# Patient Record
Sex: Female | Born: 1955 | Race: White | Hispanic: No | Marital: Married | Smoking: Former smoker
Health system: Southern US, Community
[De-identification: ages and names within clinical notes are randomized; demographics above are authoritative.]

## PROBLEM LIST (undated history)

## (undated) DIAGNOSIS — J449 Chronic obstructive pulmonary disease, unspecified: Secondary | ICD-10-CM

## (undated) DIAGNOSIS — M1990 Unspecified osteoarthritis, unspecified site: Secondary | ICD-10-CM

## (undated) DIAGNOSIS — I10 Essential (primary) hypertension: Secondary | ICD-10-CM

## (undated) DIAGNOSIS — K5730 Diverticulosis of large intestine without perforation or abscess without bleeding: Secondary | ICD-10-CM

## (undated) DIAGNOSIS — M5030 Other cervical disc degeneration, unspecified cervical region: Secondary | ICD-10-CM

## (undated) DIAGNOSIS — A0472 Enterocolitis due to Clostridium difficile, not specified as recurrent: Secondary | ICD-10-CM

## (undated) DIAGNOSIS — M797 Fibromyalgia: Secondary | ICD-10-CM

## (undated) HISTORY — PX: ABDOMINAL HYSTERECTOMY: SHX81

---

## 1993-04-19 HISTORY — PX: ABDOMINAL HYSTERECTOMY: SHX81

## 2009-01-17 DEATH — deceased

## 2010-04-25 ENCOUNTER — Ambulatory Visit
Admission: RE | Admit: 2010-04-25 | Discharge: 2010-04-25 | Payer: Self-pay | Source: Home / Self Care | Attending: Family Medicine | Admitting: Family Medicine

## 2010-04-25 ENCOUNTER — Encounter: Payer: Self-pay | Admitting: Family Medicine

## 2010-04-25 DIAGNOSIS — M1990 Unspecified osteoarthritis, unspecified site: Secondary | ICD-10-CM | POA: Insufficient documentation

## 2010-04-25 DIAGNOSIS — F329 Major depressive disorder, single episode, unspecified: Secondary | ICD-10-CM | POA: Insufficient documentation

## 2010-04-25 DIAGNOSIS — I10 Essential (primary) hypertension: Secondary | ICD-10-CM | POA: Insufficient documentation

## 2010-04-25 DIAGNOSIS — Z8719 Personal history of other diseases of the digestive system: Secondary | ICD-10-CM | POA: Insufficient documentation

## 2010-05-21 NOTE — Assessment & Plan Note (Signed)
Summary: REFILL PRESCRIPTION/EVM   Vital Signs:  Patient Profile:   55 Years Old Female CC:      rx refill Height:     60.5 inches Weight:      119 pounds BMI:     22.94 O2 Sat:      99 % O2 treatment:    Room Air Temp:     98.4 degrees F Pulse rate:   85 / minute BP sitting:   158 / 88  (left arm) Cuff size:   regular  Vitals Entered By: Haze Boyden, CMA (April 25, 2010 11:38 AM)                  Current Allergies (reviewed today): ! * PENICILLINHistory of Present Illness History from: patient Reason for visit: see chief complaint Chief Complaint: rx refill History of Present Illness: This patient is a 55 year old female who has relocated from [facility]. She reports that she grew up in [facility] and lives here now with her husband. She has not seen a physician in nearly 2 years. She reports that she stopped taking all her medications when she left [place]. She reports that she has osteoarthritis and depression and diverticulitis. She also has hypertension. She reports that after several months of not taking her medication she noticed an exacerbation of her arthritis. She reports that she started taking her Celebrex and Cymbalta and reports that there has been a significant improvement. She had used some of the old prescription bottles that she had left over at home. She is presenting today because she would like to have a prescription refill for her Cymbalta and also Celebrex. She reports that she was taking lisinopril until she stopped taking all of her medications many months back. She reports that she's having some fatigue but no chest pain or shortness of breath. She had a small bout of diverticulitis several weeks ago and was seen at the [facility]  urgent care. The patient reports that she had some blood work done at that time. She also says that she had a urinalysis done. She reports that she is trying to get an appointment with a  primary care physician at the [facility] clinic.  the patient does not have medical insurance at this time. She reports that she does have a new job and she has been working. The patient reports that when she started taking her Cymbalta and Celebrex again she felt much better. Her mood has improved considerably and she says that her joints feel much better. She is still having some joint pain but overall significant improvement. She had been seeing a rheumatologist in [facility] but reports that she would like to establish care with a rheumatologist locally.  REVIEW OF SYSTEMS Constitutional Symptoms       Complains of fatigue.     Denies fever, chills, night sweats, weight loss, and weight gain.  Eyes       Denies change in vision, eye pain, eye discharge, glasses, contact lenses, and eye surgery. Ear/Nose/Throat/Mouth       Complains of sinus problems.      Denies hearing loss/aids, change in hearing, ear pain, ear discharge, dizziness, frequent runny nose, frequent nose bleeds, sore throat, hoarseness, and tooth pain or bleeding.  Respiratory       Denies dry cough, productive cough, wheezing, shortness of breath, asthma, bronchitis, and emphysema/COPD.  Cardiovascular       Denies murmurs, chest pain, and tires easily  with exhertion.    Gastrointestinal       Denies stomach pain, nausea/vomiting, diarrhea, constipation, blood in bowel movements, and indigestion. Genitourniary       Denies painful urination, kidney stones, and loss of urinary control. Neurological       Denies paralysis, seizures, and fainting/blackouts. Musculoskeletal       Complains of muscle pain, joint pain, decreased range of motion, and muscle weakness.      Denies joint stiffness, redness, swelling, and gout.  Skin       Denies bruising, unusual mles/lumps or sores, and hair/skin or nail changes.  Psych       Complains of anxiety/stress and depression.      Denies mood changes, temper/anger issues,  speech problems, and sleep problems.  Past History:  Family History: Last updated: 04/28/2010 mother deceased father alive- heart disease sister alive healthy  Social History: Last updated: 04-28-10 Occupation: Designer, industrial/product work Married Current Smoker Alcohol use-yes Drug use-no Regular exercise-yes  Risk Factors: Exercise: yes (04-28-10)  Risk Factors: Smoking Status: current (04-28-10) Packs/Day: 1.0 (2010/04/28)  Past Medical History: Rheumatoid arthritis Diverticulitis, hx of Chronic Active Nicotine Dependence  Past Surgical History: Hysterectomy 16 years ago  Current Medications (verified): 1)  Cymbalta 60 Mg Cpep (Duloxetine Hcl) .... Take 1 Tabket By Mouth Once Daily 2)  Celebrex 200 Mg Caps (Celecoxib) .... Take 1 Tablet By Mouth Once Daily  Allergies (verified): 1)  ! * Penicillin   Family History: mother deceased father alive- heart disease sister alive healthy  Social History: Occupation: Designer, industrial/product work Married Current Smoker Alcohol use-yes Drug use-no Regular exercise-yes Smoking Status:  current Drug Use:  no Does Patient Exercise:  yes Packs/Day:  1.0 Physical Exam General appearance: well developed, well nourished, no acute distress Head: normocephalic, atraumatic Eyes: conjunctivae and lids normal Pupils: equal, round, reactive to light Ears: normal, no lesions or deformities Nasal: swollen red turbinates with congestion Oral/Pharynx: tongue normal, posterior pharynx without erythema or exudate Neck: neck supple,  trachea midline, no masses Chest/Lungs: hyperexpanded, no rales, wheezes, or rhonchi bilateral, breath sounds equal without effort Heart: regular rate and  rhythm, no murmur Abdomen: soft, non-tender without obvious organomegaly Extremities: arthritic changes seen in the hands and feet Neurological: grossly intact and non-focal Skin: no obvious rashes or lesions MSE: oriented to time, place, and  person Assessment Problems:   New Problems: UNSPECIFIED ESSENTIAL HYPERTENSION (ICD-401.9) DEPRESSION, MAJOR (ICD-296.20) DIVERTICULITIS, HX OF (ICD-V12.79) OSTEOARTHROS UNSPEC GEN/LOC OTH SPEC SITES (ZOX-096.04)   Patient Education: Patient and/or caregiver instructed in the following: rest, fluids. The risks, benefits and possible side effects were clearly explained and discussed with the patient.  The patient verbalized clear understanding.  The patient was given instructions to return if symptoms don't improve, worsen or new changes develop.  If it is not during clinic hours and the patient cannot get back to this clinic then the patient was told to seek medical care at an available urgent care or emergency department.  The patient verbalized understanding.   Demonstrates willingness to comply.  Plan New Medications/Changes: HYDROCHLOROTHIAZIDE 12.5 MG TABS (HYDROCHLOROTHIAZIDE) take 1 by mouth daily for blood pressure  #30 x 1, 2010-04-28, Asiah Befort MD CELEBREX 200 MG CAPS (CELECOXIB) take 1 by mouth daily as needed for arthritis pain  #30 x 0, 04/28/2010, Jakira Mcfadden MD CYMBALTA 60 MG CPEP (DULOXETINE HCL) take 1 by mouth daily  #30 x 2, 04/28/2010, Standley Dakins MD  Planning Comments:   the patient declined to have any  blood work done today. I recommended that she go ahead and have the blood work done but she declined. I also explained to the patient that because she's taking Celebrex we need to monitor her kidney function and blood pressure. She reports that she is scheduled to get a primary care doctor in the next 1-2 weeks and she will have a complete physical examination done at that time. In addition, I recommended that the patient resume taking blood pressure medications and having a blood pressure monitor closely. She stopped taking her blood pressure medication nearly a year ago and has not resumed taking it. I want to start her back on HCTZ 12.5 mg once daily and  have her to return to have her blood pressure checked in one week. She can either come back to this clinic or to have it done with her new primary care provider.  The Patient Verbalized Understanding  Follow Up: Follow up on an as needed basis, Follow up with Primary Physician Follow Up: one week with primary care provider  The patient and/or caregiver has been counseled thoroughly with regard to medications prescribed including dosage, schedule, interactions, rationale for use, and possible side effects and they verbalize understanding.  Diagnoses and expected course of recovery discussed and will return if not improved as expected or if the condition worsens. Patient and/or caregiver verbalized understanding.  Prescriptions: HYDROCHLOROTHIAZIDE 12.5 MG TABS (HYDROCHLOROTHIAZIDE) take 1 by mouth daily for blood pressure  #30 x 1   Entered and Authorized by:   Standley Dakins MD   Signed by:   Standley Dakins MD on 04/25/2010   Method used:   Electronically to        [facility] [phone]* (retail)       [address] [place], [place]  [address]       Ph: [phone] or [phone]       Fax: [phone]   RxID:   [id number] CELEBREX 200 MG CAPS (CELECOXIB) take 1 by mouth daily as needed for arthritis pain  #30 x 0   Entered and Authorized by:   Standley Dakins MD   Signed by:   Standley Dakins MD on 04/25/2010   Method used:   Electronically to        [facility] [phone]* (retail)       [address] [place], [place]  [address]       Ph: [phone] or [phone]       Fax: [phone]   RxID:   [id number] CYMBALTA 60 MG CPEP (DULOXETINE HCL) take 1 by mouth daily  #30 x 2   Entered and Authorized by:   Standley Dakins MD   Signed by:   Standley Dakins MD on 04/25/2010   Method used:   Electronically to        [facility] [phone]* (retail)       [address] [place], [place]   [address]       Ph: [phone] or [phone]       Fax: [phone]   RxID:   [id number]   Patient Instructions: 1)  Please have your blood pressure retested in one week. 2)  Please take your blood pressure medications. 3)  Go to the pharmacy and pick up your prescription (s).  It may take up to 30 mins for electronic prescriptions to be delivered to the pharmacy.  Please call if your pharmacy has not received your prescriptions after 30 minutes.   4)  Please get a primary care provider and start getting monitored and evaluated regularly for your medical conditions. 5)  Tobacco is very bad for your health and your loved ones! You Should stop smoking!. 6)  Stop Smoking Tips: Choose a Quit date. Cut down before the Quit date. decide what you will do as a substitute when you feel the urge to smoke(gum,toothpick,exercise). 7)  The patient was counseled and advised to stop using all tobacco products.  Medical assistance was offered and the patient was encouraged to call 1-800-QUIT-NOW to get a smoking cessation coach.    8)  The patient was informed that there is no on-call provider or services available at this clinic during off-hours (when the clinic is closed).  If the patient developed a problem or concern that required immediate attention, the patient was advised to go the the nearest available urgent care or emergency department for medical care.  The patient verbalized understanding.       Preventive Screening-Counseling & Management  Alcohol-Tobacco     Smoking Status: current     Smoking Cessation Counseling: yes     Smoke Cessation Stage: precontemplative     Packs/Day: 1.0     Year Started: 1990     Tobacco Counseling: to quit use of tobacco products  Caffeine-Diet-Exercise     Does Patient Exercise: yes      Drug Use:  no.

## 2010-05-21 NOTE — Progress Notes (Signed)
Summary: Medical records  Medical records   Imported By: Dorna Leitz 04/27/2010 15:50:12  _____________________________________________________________________  External Attachment:    Type:   Image     Comment:   External Document

## 2010-08-27 ENCOUNTER — Ambulatory Visit: Payer: Self-pay | Admitting: Internal Medicine

## 2010-08-28 ENCOUNTER — Ambulatory Visit: Payer: Self-pay | Admitting: Internal Medicine

## 2010-10-12 ENCOUNTER — Ambulatory Visit: Payer: Self-pay | Admitting: Gastroenterology

## 2011-09-30 ENCOUNTER — Ambulatory Visit: Payer: Self-pay | Admitting: Internal Medicine

## 2012-10-02 ENCOUNTER — Ambulatory Visit: Payer: Self-pay | Admitting: Internal Medicine

## 2013-10-05 ENCOUNTER — Ambulatory Visit: Payer: Self-pay | Admitting: Internal Medicine

## 2013-12-03 ENCOUNTER — Ambulatory Visit: Payer: Self-pay | Admitting: Gastroenterology

## 2013-12-04 LAB — PATHOLOGY REPORT

## 2014-07-01 ENCOUNTER — Emergency Department: Payer: Self-pay | Admitting: Emergency Medicine

## 2014-07-24 ENCOUNTER — Other Ambulatory Visit
Admit: 2014-07-24 | Disposition: A | Discharge status: Home / Self Care | Payer: Self-pay | Attending: Gastroenterology | Admitting: Gastroenterology

## 2014-07-24 LAB — CLOSTRIDIUM DIFFICILE(ARMC)

## 2014-08-16 ENCOUNTER — Other Ambulatory Visit
Admit: 2014-08-16 | Disposition: A | Discharge status: Home / Self Care | Payer: Self-pay | Attending: Gastroenterology | Admitting: Gastroenterology

## 2014-08-16 LAB — CLOSTRIDIUM DIFFICILE(ARMC)

## 2014-08-18 LAB — STOOL CULTURE

## 2014-09-02 ENCOUNTER — Encounter: Payer: Self-pay | Admitting: *Deleted

## 2014-09-02 ENCOUNTER — Ambulatory Visit: Payer: PRIVATE HEALTH INSURANCE | Admitting: Anesthesiology

## 2014-09-02 ENCOUNTER — Ambulatory Visit
Admission: RE | Admit: 2014-09-02 | Discharge: 2014-09-02 | Disposition: A | Discharge status: Home / Self Care | Payer: PRIVATE HEALTH INSURANCE | Source: Ambulatory Visit | Attending: Unknown Physician Specialty | Admitting: Unknown Physician Specialty

## 2014-09-02 ENCOUNTER — Encounter
Admission: RE | Disposition: A | Discharge status: Home / Self Care | Payer: Self-pay | Source: Ambulatory Visit | Attending: Unknown Physician Specialty

## 2014-09-02 DIAGNOSIS — F17210 Nicotine dependence, cigarettes, uncomplicated: Secondary | ICD-10-CM | POA: Insufficient documentation

## 2014-09-02 DIAGNOSIS — M5030 Other cervical disc degeneration, unspecified cervical region: Secondary | ICD-10-CM | POA: Diagnosis not present

## 2014-09-02 DIAGNOSIS — M1990 Unspecified osteoarthritis, unspecified site: Secondary | ICD-10-CM | POA: Diagnosis not present

## 2014-09-02 DIAGNOSIS — I10 Essential (primary) hypertension: Secondary | ICD-10-CM | POA: Insufficient documentation

## 2014-09-02 DIAGNOSIS — J449 Chronic obstructive pulmonary disease, unspecified: Secondary | ICD-10-CM | POA: Insufficient documentation

## 2014-09-02 DIAGNOSIS — Z880 Allergy status to penicillin: Secondary | ICD-10-CM | POA: Insufficient documentation

## 2014-09-02 DIAGNOSIS — K5730 Diverticulosis of large intestine without perforation or abscess without bleeding: Secondary | ICD-10-CM | POA: Insufficient documentation

## 2014-09-02 DIAGNOSIS — K640 First degree hemorrhoids: Secondary | ICD-10-CM | POA: Insufficient documentation

## 2014-09-02 DIAGNOSIS — Z885 Allergy status to narcotic agent status: Secondary | ICD-10-CM | POA: Insufficient documentation

## 2014-09-02 DIAGNOSIS — Z90710 Acquired absence of both cervix and uterus: Secondary | ICD-10-CM | POA: Insufficient documentation

## 2014-09-02 DIAGNOSIS — Z79899 Other long term (current) drug therapy: Secondary | ICD-10-CM | POA: Insufficient documentation

## 2014-09-02 DIAGNOSIS — K648 Other hemorrhoids: Secondary | ICD-10-CM | POA: Diagnosis not present

## 2014-09-02 DIAGNOSIS — A047 Enterocolitis due to Clostridium difficile: Secondary | ICD-10-CM | POA: Diagnosis not present

## 2014-09-02 HISTORY — DX: Unspecified osteoarthritis, unspecified site: M19.90

## 2014-09-02 HISTORY — DX: Essential (primary) hypertension: I10

## 2014-09-02 HISTORY — PX: FECAL TRANSPLANT: SHX6383

## 2014-09-02 HISTORY — DX: Chronic obstructive pulmonary disease, unspecified: J44.9

## 2014-09-02 HISTORY — DX: Other cervical disc degeneration, unspecified cervical region: M50.30

## 2014-09-02 HISTORY — DX: Fibromyalgia: M79.7

## 2014-09-02 HISTORY — DX: Diverticulosis of large intestine without perforation or abscess without bleeding: K57.30

## 2014-09-02 HISTORY — PX: COLONOSCOPY: SHX5424

## 2014-09-02 SURGERY — COLONOSCOPY
Anesthesia: General

## 2014-09-02 MED ORDER — SODIUM CHLORIDE 0.9 % IV SOLN: INTRAVENOUS | Status: DC

## 2014-09-02 MED ORDER — PROPOFOL 10 MG/ML IV BOLUS
INTRAVENOUS | Status: DC | PRN
  Administered 2014-09-02: 80 mg via INTRAVENOUS

## 2014-09-02 MED ORDER — PROPOFOL INFUSION 10 MG/ML OPTIME
INTRAVENOUS | Status: DC | PRN
  Administered 2014-09-02: 120 ug/kg/min via INTRAVENOUS

## 2014-09-02 MED ORDER — LACTATED RINGERS IV SOLN
INTRAVENOUS | Status: DC
  Administered 2014-09-02: 11:00:00 via INTRAVENOUS

## 2014-09-02 NOTE — Anesthesia Postprocedure Evaluation (Signed)
  Anesthesia Post-op Note  Patient: Rose Lloyd  Procedure(s) Performed: Procedure(s): COLONOSCOPY (N/A) FECAL TRANSPLANT (N/A)  Anesthesia type:General  Patient location: PACU  Post pain: Pain level controlled  Post assessment: Post-op Vital signs reviewed, Patient's Cardiovascular Status Stable, Respiratory Function Stable, Patent Airway and No signs of Nausea or vomiting  Post vital signs: Reviewed and stable  Last Vitals:  Filed Vitals:   09/02/14 1035  BP: 138/79  Pulse: 75  Temp: 36.7 C  Resp: 18    Level of consciousness: awake, alert  and patient cooperative  Complications: No apparent anesthesia complications

## 2014-09-02 NOTE — H&P (Signed)
   Primary Care Physician:  Idelle Crouch, MD Primary Gastroenterologist:  Dr. Vira Agar  Pre-Procedure History & Physical: HPI:  Rose Lloyd is a 59 y.o. female is here for an colonoscopy.   Past Medical History  Diagnosis Date  . COPD (chronic obstructive pulmonary disease)   . Hypertension   . Osteoarthritis   . Degenerative disc disease, cervical   . Diverticula of colon     Past Surgical History  Procedure Laterality Date  . Abdominal hysterectomy      Prior to Admission medications   Medication Sig Start Date End Date Taking? Authorizing Provider  acetaminophen (TYLENOL) 500 MG tablet Take 500 mg by mouth every 6 (six) hours as needed for moderate pain.   Yes Historical Provider, MD  diclofenac (VOLTAREN) 75 MG EC tablet Take 75 mg by mouth once.   Yes Historical Provider, MD  DULoxetine (CYMBALTA) 60 MG capsule Take 60 mg by mouth daily.   Yes Historical Provider, MD  lisinopril (PRINIVIL,ZESTRIL) 10 MG tablet Take 10 mg by mouth daily.   Yes Historical Provider, MD  Probiotic Product (PROBIOTIC DAILY PO) Take 1 capsule by mouth daily.   Yes Historical Provider, MD  vancomycin (VANCOCIN) 250 MG capsule Take 250 mg by mouth 4 (four) times daily.    Historical Provider, MD    Allergies as of 08/26/2014 - Review Complete 08/26/2014  Allergen Reaction Noted  . Codeine Nausea And Vomiting 08/26/2014  . Penicillins  04/25/2010    History reviewed. No pertinent family history.  History   Social History  . Marital Status: Married    Spouse Name: N/A  . Number of Children: N/A  . Years of Education: N/A   Occupational History  . Not on file.   Social History Main Topics  . Smoking status: Current Every Day Smoker -- 1.00 packs/day for 40 years    Types: Cigarettes  . Smokeless tobacco: Not on file  . Alcohol Use: No  . Drug Use: No  . Sexual Activity: Not on file   Other Topics Concern  . Not on file   Social History Narrative    Review of  Systems: See HPI, otherwise negative ROS  Physical Exam: BP 138/79 mmHg  Pulse 75  Temp(Src) 98.1 F (36.7 C) (Tympanic)  Resp 18  Ht 4\' 11"  (1.499 m)  Wt 50.803 kg (112 lb)  BMI 22.61 kg/m2 General:   Alert,  pleasant and cooperative in NAD Head:  Normocephalic and atraumatic. Neck:  Supple; no masses or thyromegaly. Lungs:  Clear throughout to auscultation.    Heart:  Regular rate and rhythm. Abdomen:  Soft, nontender and nondistended. Normal bowel sounds, without guarding, and without rebound.   Neurologic:  Alert and  oriented x4;  grossly normal neurologically.  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  Questions have been answered.  All parties agreeable.   Gaylyn Cheers, MD  09/02/2014, 11:57 AM

## 2014-09-02 NOTE — Op Note (Signed)
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 Doy Hutching, MD (Referring MD) Medicines:         Propofol per Anesthesia Complications:     No immediate complications. Procedure:         Pre-Anesthesia Assessment:                    - After reviewing the risks and benefits, the patient was                     deemed in satisfactory condition to undergo the procedure.                    After obtaining informed consent, the colonoscope was                     passed under direct vision. Throughout the procedure, the                     patient's blood pressure, pulse, and oxygen saturations                     were monitored continuously. The Colonoscope was                     introduced through the anus and advanced to the the cecum,                     identified by appendiceal orifice and ileocecal valve. The                     colonoscopy was performed without difficulty. The patient                     tolerated the procedure well. The quality of the bowel                     preparation was excellent. Findings:      The colon (entire examined portion) appeared normal. The scope was       passed to the cecum and then Fecal Microbiota Transplant       (Bacteriotherapy): Donor stool was prepared by me using milk and tap       water as per protocol. Approximately 350 mL of the emulsified donor       stool was instilled at the hepatic flexure, in the ascending colon and       in the cecum. A detailed colonoscopic exam could not be performed upon   scope withdrawal secondary to limited visibility from the instilled       stool.      Internal hemorrhoids were found during endoscopy. The hemorrhoids were       small and Grade I (internal hemorrhoids that do not prolapse). Impression:        -  The entire examined colon is normal.                    - Internal hemorrhoids.                    - Fecal Microbiota Transplant (Bacteriotherapy) performed                     in the hepatic flexure, in the ascending colon and in the                     cecum.                    - No specimens collected. Recommendation:    - The findings and recommendations were discussed with the                     patient's family. Remain in post op for 4 hours with                     turning every 30 minutes. Manya Silvas, MD 09/02/2014 12:22:55 PM This report has been signed electronically. Number of Addenda: 0 Note Initiated On: 09/02/2014 10:59 AM Scope Withdrawal Time: 0 hours 6 minutes 28 seconds  Total Procedure Duration: 0 hours 15 minutes 42 seconds       [facility]

## 2014-09-02 NOTE — Anesthesia Preprocedure Evaluation (Addendum)
Anesthesia Evaluation    Airway Mallampati: I       Dental  (+) Teeth Intact   Pulmonary COPDCurrent Smoker (1 ppd),          Cardiovascular hypertension, Pt. on medications     Neuro/Psych Depression    GI/Hepatic   Endo/Other    Renal/GU      Musculoskeletal  (+) Arthritis -, Osteoarthritis,    Abdominal   Peds  Hematology   Anesthesia Other Findings   Reproductive/Obstetrics                            Anesthesia Physical Anesthesia Plan  ASA: II  Anesthesia Plan: General   Post-op Pain Management:    Induction: Intravenous  Airway Management Planned: Nasal Cannula  Additional Equipment:   Intra-op Plan:   Post-operative Plan:   Informed Consent: I have reviewed the patients History and Physical, chart, labs and discussed the procedure including the risks, benefits and alternatives for the proposed anesthesia with the patient or authorized representative who has indicated his/her understanding and acceptance.     Plan Discussed with:   Anesthesia Plan Comments:         Anesthesia Quick Evaluation

## 2014-09-02 NOTE — Transfer of Care (Signed)
Immediate Anesthesia Transfer of Care Note  Patient: Rose Lloyd  Procedure(s) Performed: Procedure(s): COLONOSCOPY (N/A) FECAL TRANSPLANT (N/A)  Patient Location: PACU  Anesthesia Type:General  Level of Consciousness: awake, alert  and oriented  Airway & Oxygen Therapy: Patient Spontanous Breathing and Patient connected to nasal cannula oxygen  Post-op Assessment: Report given to RN  Post vital signs: Reviewed and stable  Last Vitals:  Filed Vitals:   09/02/14 1226  BP:   Pulse:   Temp: 36.3 C  Resp:     Complications: No apparent anesthesia complications

## 2014-09-03 ENCOUNTER — Encounter: Payer: Self-pay | Admitting: Unknown Physician Specialty

## 2014-10-23 ENCOUNTER — Other Ambulatory Visit: Payer: Self-pay | Admitting: Internal Medicine

## 2014-10-23 DIAGNOSIS — J431 Panlobular emphysema: Secondary | ICD-10-CM

## 2014-10-23 DIAGNOSIS — R911 Solitary pulmonary nodule: Secondary | ICD-10-CM

## 2014-10-29 ENCOUNTER — Ambulatory Visit
Admission: RE | Admit: 2014-10-29 | Discharge: 2014-10-29 | Disposition: A | Discharge status: Home / Self Care | Payer: PRIVATE HEALTH INSURANCE | Source: Ambulatory Visit | Attending: Internal Medicine | Admitting: Internal Medicine

## 2014-10-29 DIAGNOSIS — R05 Cough: Secondary | ICD-10-CM | POA: Diagnosis not present

## 2014-10-29 DIAGNOSIS — J431 Panlobular emphysema: Secondary | ICD-10-CM

## 2014-10-29 DIAGNOSIS — R911 Solitary pulmonary nodule: Secondary | ICD-10-CM | POA: Diagnosis not present

## 2014-10-29 DIAGNOSIS — R0602 Shortness of breath: Secondary | ICD-10-CM | POA: Insufficient documentation

## 2014-10-29 MED ORDER — IOHEXOL 300 MG/ML  SOLN
75.0000 mL | Freq: Once | INTRAMUSCULAR | Status: AC | PRN
Start: 2014-10-29 — End: 2014-10-29
  Administered 2014-10-29: 75 mL via INTRAVENOUS

## 2014-10-30 ENCOUNTER — Other Ambulatory Visit: Payer: Self-pay | Admitting: Internal Medicine

## 2014-10-30 DIAGNOSIS — R911 Solitary pulmonary nodule: Secondary | ICD-10-CM

## 2015-05-01 ENCOUNTER — Ambulatory Visit: Payer: PRIVATE HEALTH INSURANCE

## 2015-05-05 ENCOUNTER — Ambulatory Visit
Admission: RE | Admit: 2015-05-05 | Discharge: 2015-05-05 | Disposition: A | Discharge status: Home / Self Care | Payer: PRIVATE HEALTH INSURANCE | Source: Ambulatory Visit | Attending: Internal Medicine | Admitting: Internal Medicine

## 2015-05-05 DIAGNOSIS — R918 Other nonspecific abnormal finding of lung field: Secondary | ICD-10-CM | POA: Insufficient documentation

## 2015-05-05 DIAGNOSIS — R911 Solitary pulmonary nodule: Secondary | ICD-10-CM

## 2015-05-05 MED ORDER — IOHEXOL 300 MG/ML  SOLN
75.0000 mL | Freq: Once | INTRAMUSCULAR | Status: AC | PRN
  Administered 2015-05-05: 75 mL via INTRAVENOUS

## 2015-08-05 ENCOUNTER — Other Ambulatory Visit: Payer: Self-pay | Admitting: Internal Medicine

## 2015-08-05 DIAGNOSIS — R109 Unspecified abdominal pain: Secondary | ICD-10-CM

## 2015-08-27 ENCOUNTER — Ambulatory Visit
Admission: RE | Admit: 2015-08-27 | Discharge: 2015-08-27 | Disposition: A | Discharge status: Home / Self Care | Payer: PRIVATE HEALTH INSURANCE | Source: Ambulatory Visit | Attending: Internal Medicine | Admitting: Internal Medicine

## 2015-08-27 DIAGNOSIS — R109 Unspecified abdominal pain: Secondary | ICD-10-CM

## 2015-08-27 DIAGNOSIS — D1803 Hemangioma of intra-abdominal structures: Secondary | ICD-10-CM | POA: Diagnosis not present

## 2015-08-27 MED ORDER — GADOBENATE DIMEGLUMINE 529 MG/ML IV SOLN
15.0000 mL | Freq: Once | INTRAVENOUS | Status: AC | PRN
  Administered 2015-08-27: 12 mL via INTRAVENOUS

## 2015-09-30 ENCOUNTER — Other Ambulatory Visit: Payer: Self-pay | Admitting: Internal Medicine

## 2015-09-30 DIAGNOSIS — Z1231 Encounter for screening mammogram for malignant neoplasm of breast: Secondary | ICD-10-CM

## 2015-10-17 ENCOUNTER — Ambulatory Visit: Payer: PRIVATE HEALTH INSURANCE

## 2015-11-03 ENCOUNTER — Ambulatory Visit
Admission: RE | Admit: 2015-11-03 | Discharge: 2015-11-03 | Disposition: A | Discharge status: Home / Self Care | Payer: PRIVATE HEALTH INSURANCE | Source: Ambulatory Visit | Attending: Internal Medicine | Admitting: Internal Medicine

## 2015-11-03 DIAGNOSIS — Z1231 Encounter for screening mammogram for malignant neoplasm of breast: Secondary | ICD-10-CM | POA: Insufficient documentation

## 2016-09-09 ENCOUNTER — Other Ambulatory Visit: Payer: Self-pay | Admitting: Internal Medicine

## 2016-09-09 DIAGNOSIS — R911 Solitary pulmonary nodule: Secondary | ICD-10-CM

## 2016-10-01 ENCOUNTER — Ambulatory Visit: Payer: PRIVATE HEALTH INSURANCE

## 2016-10-08 ENCOUNTER — Ambulatory Visit
Admission: RE | Admit: 2016-10-08 | Discharge: 2016-10-08 | Disposition: A | Discharge status: Home / Self Care | Payer: PRIVATE HEALTH INSURANCE | Source: Ambulatory Visit | Attending: Internal Medicine | Admitting: Internal Medicine

## 2016-10-08 DIAGNOSIS — R918 Other nonspecific abnormal finding of lung field: Secondary | ICD-10-CM | POA: Diagnosis not present

## 2016-10-08 DIAGNOSIS — I700 Atherosclerosis of aorta: Secondary | ICD-10-CM | POA: Diagnosis not present

## 2016-10-08 DIAGNOSIS — R911 Solitary pulmonary nodule: Secondary | ICD-10-CM

## 2016-10-16 ENCOUNTER — Encounter: Payer: Self-pay | Admitting: Emergency Medicine

## 2016-10-16 ENCOUNTER — Emergency Department
Admission: EM | Admit: 2016-10-16 | Discharge: 2016-10-16 | Disposition: A | Discharge status: Home / Self Care | Payer: PRIVATE HEALTH INSURANCE | Attending: Emergency Medicine | Admitting: Emergency Medicine

## 2016-10-16 DIAGNOSIS — A09 Infectious gastroenteritis and colitis, unspecified: Secondary | ICD-10-CM | POA: Insufficient documentation

## 2016-10-16 DIAGNOSIS — A0472 Enterocolitis due to Clostridium difficile, not specified as recurrent: Secondary | ICD-10-CM | POA: Diagnosis not present

## 2016-10-16 DIAGNOSIS — F17228 Nicotine dependence, chewing tobacco, with other nicotine-induced disorders: Secondary | ICD-10-CM | POA: Diagnosis not present

## 2016-10-16 DIAGNOSIS — J449 Chronic obstructive pulmonary disease, unspecified: Secondary | ICD-10-CM | POA: Diagnosis not present

## 2016-10-16 DIAGNOSIS — I10 Essential (primary) hypertension: Secondary | ICD-10-CM | POA: Insufficient documentation

## 2016-10-16 DIAGNOSIS — R197 Diarrhea, unspecified: Secondary | ICD-10-CM | POA: Diagnosis present

## 2016-10-16 HISTORY — DX: Enterocolitis due to Clostridium difficile, not specified as recurrent: A04.72

## 2016-10-16 LAB — COMPREHENSIVE METABOLIC PANEL
ALBUMIN: 4 g/dL (ref 3.5–5.0)
ALT: 18 U/L (ref 14–54)
AST: 20 U/L (ref 15–41)
Alkaline Phosphatase: 42 U/L (ref 38–126)
Anion gap: 6 (ref 5–15)
BUN: 8 mg/dL (ref 6–20)
CHLORIDE: 104 mmol/L (ref 101–111)
CO2: 27 mmol/L (ref 22–32)
CREATININE: 0.63 mg/dL (ref 0.44–1.00)
Calcium: 9.4 mg/dL (ref 8.9–10.3)
GFR calc non Af Amer: >60 mL/min (ref >60)
GLUCOSE: 110 mg/dL — AB (ref 65–99)
Potassium: 3.7 mmol/L (ref 3.5–5.1)
SODIUM: 137 mmol/L (ref 135–145)
Total Bilirubin: 0.7 mg/dL (ref 0.3–1.2)
Total Protein: 7.2 g/dL (ref 6.5–8.1)

## 2016-10-16 LAB — URINALYSIS, COMPLETE (UACMP) WITH MICROSCOPIC
Bacteria, UA: NONE SEEN
Bilirubin Urine: NEGATIVE
Glucose, UA: NEGATIVE mg/dL
KETONES UR: NEGATIVE mg/dL
Leukocytes, UA: NEGATIVE
Nitrite: NEGATIVE
PH: 7 (ref 5.0–8.0)
PROTEIN: NEGATIVE mg/dL
Specific Gravity, Urine: 1.015 (ref 1.005–1.030)

## 2016-10-16 LAB — CBC
HCT: 41.1 % (ref 35.0–47.0)
Hemoglobin: 14.3 g/dL (ref 12.0–16.0)
MCH: 33.3 pg (ref 26.0–34.0)
MCHC: 34.8 g/dL (ref 32.0–36.0)
MCV: 95.8 fL (ref 80.0–100.0)
PLATELETS: 285 10*3/uL (ref 150–440)
RBC: 4.29 MIL/uL (ref 3.80–5.20)
RDW: 13.3 % (ref 11.5–14.5)
WBC: 4.9 10*3/uL (ref 3.6–11.0)

## 2016-10-16 LAB — LIPASE, BLOOD: Lipase: 37 U/L (ref 11–51)

## 2016-10-16 LAB — CLOSTRIDIUM DIFFICILE BY PCR: Toxigenic C. Difficile by PCR: POSITIVE — AB

## 2016-10-16 LAB — C DIFFICILE QUICK SCREEN W PCR REFLEX
C DIFFICILE (CDIFF) TOXIN: NEGATIVE
C Diff antigen: POSITIVE — AB

## 2016-10-16 MED ORDER — DICYCLOMINE HCL 20 MG PO TABS: 20.0000 mg | ORAL_TABLET | Freq: Three times a day (TID) | ORAL | 0 refills | Status: DC | PRN

## 2016-10-16 MED ORDER — VANCOMYCIN HCL 250 MG PO CAPS
250.0000 mg | ORAL_CAPSULE | Freq: Four times a day (QID) | ORAL | 0 refills | Status: AC
Start: 2016-10-16 — End: 2016-10-26

## 2016-10-16 NOTE — Discharge Instructions (Signed)
Please seek medical attention for any high fevers, chest pain, shortness of breath, change in behavior, persistent vomiting, bloody stool or any other new or concerning symptoms.  

## 2016-10-16 NOTE — ED Provider Notes (Signed)
[facility] Emergency Department Provider Note    ____________________________________________   I have reviewed the triage vital signs and the nursing notes.   HISTORY  Chief Complaint Diarrhea   History limited by: Not Limited   HPI Rose Lloyd is a 61 y.o. female who presents to the emergency department today because of concerns for diarrhea abdominal cramping and potential C. difficile. The patient states that the diarrhea started yesterday. She had over 20 episodes. Small amount of mucus and blood was noted however profound bleeding. This was coming by abdominal cramping primarily lower abdomen. She states the pain is severe when it comes on. 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 She did just come off a course of antibiotics for dental infection.   Past Medical History:  Diagnosis Date  . Clostridium difficile diarrhea   . COPD (chronic obstructive pulmonary disease) ([place])   . Degenerative disc disease, cervical   . Diverticula of colon   . Hypertension   . Osteoarthritis     Patient Active Problem List   Diagnosis Date Noted  . DEPRESSION, MAJOR 04/25/2010  . UNSPECIFIED ESSENTIAL HYPERTENSION 04/25/2010  . OSTEOARTHROS UNSPEC GEN/LOC OTH SPEC SITES 04/25/2010  . DIVERTICULITIS, HX OF 04/25/2010    Past Surgical History:  Procedure Laterality Date  . ABDOMINAL HYSTERECTOMY    . COLONOSCOPY N/A 09/02/2014   Procedure: COLONOSCOPY;  Surgeon: Manya Silvas, MD;  Location: [facility] ENDOSCOPY;  Service: Endoscopy;  Laterality: N/A;  . FECAL TRANSPLANT N/A 09/02/2014   Procedure: FECAL TRANSPLANT;  Surgeon: Manya Silvas, MD;  Location: [facility] ENDOSCOPY;  Service: Endoscopy;  Laterality: N/A;    Prior to Admission medications   Medication Sig Start Date End Date Taking? Authorizing Provider  acetaminophen (TYLENOL) 500 MG tablet Take 500 mg by mouth every 6 (six) hours as needed for moderate pain.     [provider]  diclofenac (VOLTAREN) 75 MG EC tablet Take 75 mg by mouth once.    [provider]  DULoxetine (CYMBALTA) 60 MG capsule Take 60 mg by mouth daily.    [provider]  lisinopril (PRINIVIL,ZESTRIL) 10 MG tablet Take 10 mg by mouth daily.    [provider]  Probiotic Product (PROBIOTIC DAILY PO) Take 1 capsule by mouth daily.    [provider]  vancomycin (VANCOCIN) 250 MG capsule Take 250 mg by mouth 4 (four) times daily.    [provider]    Allergies Codeine and Penicillins  Family History  Problem Relation Age of Onset  . Breast cancer Maternal Grandmother        65's    Social History Social History  Substance Use Topics  . Smoking status: Former Smoker    Packs/day: 1.00    Years: 40.00    Types: E-cigarettes    Quit date: 2016  . Smokeless tobacco: Current User  . Alcohol use No    Review of Systems Constitutional: Positive for fever. Eyes: No visual changes. ENT: Positive for right sided mouth pain that has resolved. Cardiovascular: Denies chest pain. Respiratory: Denies shortness of breath. Gastrointestinal: Positive for abdominal cramping and diarrhea. Genitourinary: Negative for dysuria. Musculoskeletal: Positive for low back soreness. Skin: Negative for rash. Neurological: Negative for headaches, focal weakness or numbness.  ____________________________________________   PHYSICAL EXAM:  VITAL SIGNS: ED Triage Vitals  Enc Vitals Group     BP 10/16/16 1400 (!) 150/90     Pulse Rate 10/16/16 1400 80  Resp 10/16/16 1400 20     Temp 10/16/16 1400 98.3 F (36.8 C)     Temp Source 10/16/16 1400 Oral     SpO2 10/16/16 1400 100 %     Weight 10/16/16 1401 128 lb (58.1 kg)     Height 10/16/16 1401 4\' 10"  (1.473 m)     Head Circumference --      Peak Flow --      Pain Score 10/16/16 1502 10   Constitutional: Alert and oriented. Well appearing and in no distress. Eyes: Conjunctivae  are normal.  ENT   Head: Normocephalic and atraumatic.   Nose: No congestion/rhinnorhea.   Mouth/Throat: Mucous membranes are moist.   Neck: No stridor. Hematological/Lymphatic/Immunilogical: No cervical lymphadenopathy. Cardiovascular: Normal rate, regular rhythm.  No murmurs, rubs, or gallops.  Respiratory: Normal respiratory effort without tachypnea nor retractions. Breath sounds are clear and equal bilaterally. No wheezes/rales/rhonchi. Gastrointestinal: Soft and mild tenderness to palpation in the lower abdomen. No rebound. No guarding.  Genitourinary: Deferred Musculoskeletal: Normal range of motion in all extremities. No lower extremity edema. Neurologic:  Normal speech and language. No gross focal neurologic deficits are appreciated.  Skin:  Skin is warm, dry and intact. No rash noted. Psychiatric: Mood and affect are normal. Speech and behavior are normal. Patient exhibits appropriate insight and judgment.  ____________________________________________    LABS (pertinent positives/negatives)  Labs Reviewed  C DIFFICILE QUICK SCREEN W PCR REFLEX - Abnormal; Notable for the following:       Result Value   C Diff antigen POSITIVE (*)    All other components within normal limits  CLOSTRIDIUM DIFFICILE BY PCR - Abnormal; Notable for the following:    Toxigenic C Difficile by pcr POSITIVE (*)    All other components within normal limits  COMPREHENSIVE METABOLIC PANEL - Abnormal; Notable for the following:    Glucose, Bld 110 (*)    All other components within normal limits  URINALYSIS, COMPLETE (UACMP) WITH MICROSCOPIC - Abnormal; Notable for the following:    Color, Urine YELLOW (*)    APPearance CLEAR (*)    Hgb urine dipstick SMALL (*)    Squamous Epithelial / LPF 0-5 (*)    All other components within normal limits  GASTROINTESTINAL PANEL BY PCR, STOOL (REPLACES STOOL CULTURE)  LIPASE, BLOOD  CBC      ____________________________________________   EKG  None  ____________________________________________    RADIOLOGY  None  ____________________________________________   PROCEDURES  Procedures  ____________________________________________   INITIAL IMPRESSION / ASSESSMENT AND PLAN / ED COURSE  Pertinent labs & imaging results that were available during my care of the patient were reviewed by me and considered in my medical decision making (see chart for details).  She presented to the emergency department today because of concerns for diarrhea and recurrence of Clostridium difficile. Unfortunately labwork today's consistent with C. difficile. Will start patient on vancomycin. Did discuss with patient importance of GI follow-up.  ____________________________________________   FINAL CLINICAL IMPRESSION(S) / ED DIAGNOSES  Final diagnoses:  Diarrhea of infectious origin  C. difficile diarrhea     Note: This dictation was prepared with Dragon dictation. Any transcriptional errors that result from this process are unintentional     Nance Pear, MD 10/16/16 [phone]

## 2016-10-16 NOTE — ED Triage Notes (Signed)
Pt presents to ED c/o diarrhea. Pt states she had 22 episodes yesterday. Has had c diff 3 times and needed fecal transplant. Has been on erythromycin for dental infection recently.

## 2016-12-02 ENCOUNTER — Other Ambulatory Visit
Admission: RE | Admit: 2016-12-02 | Discharge: 2016-12-02 | Disposition: A | Discharge status: Home / Self Care | Payer: PRIVATE HEALTH INSURANCE | Source: Ambulatory Visit | Attending: Unknown Physician Specialty | Admitting: Unknown Physician Specialty

## 2016-12-02 DIAGNOSIS — R197 Diarrhea, unspecified: Secondary | ICD-10-CM | POA: Insufficient documentation

## 2016-12-02 LAB — C DIFFICILE QUICK SCREEN W PCR REFLEX
C DIFFICILE (CDIFF) TOXIN: NEGATIVE
C DIFFICLE (CDIFF) ANTIGEN: POSITIVE — AB

## 2016-12-02 LAB — CLOSTRIDIUM DIFFICILE BY PCR: CDIFFPCR: NEGATIVE

## 2016-12-17 ENCOUNTER — Other Ambulatory Visit
Admission: RE | Admit: 2016-12-17 | Discharge: 2016-12-17 | Disposition: A | Discharge status: Home / Self Care | Payer: PRIVATE HEALTH INSURANCE | Source: Ambulatory Visit | Attending: Unknown Physician Specialty | Admitting: Unknown Physician Specialty

## 2016-12-17 DIAGNOSIS — R197 Diarrhea, unspecified: Secondary | ICD-10-CM | POA: Diagnosis not present

## 2016-12-17 LAB — GASTROINTESTINAL PANEL BY PCR, STOOL (REPLACES STOOL CULTURE)
ADENOVIRUS F40/41: NOT DETECTED
Astrovirus: NOT DETECTED
CAMPYLOBACTER SPECIES: NOT DETECTED
CRYPTOSPORIDIUM: NOT DETECTED
Cyclospora cayetanensis: NOT DETECTED
ENTEROTOXIGENIC E COLI (ETEC): NOT DETECTED
Entamoeba histolytica: NOT DETECTED
Enteroaggregative E coli (EAEC): NOT DETECTED
Enteropathogenic E coli (EPEC): NOT DETECTED
Giardia lamblia: NOT DETECTED
Norovirus GI/GII: NOT DETECTED
PLESIMONAS SHIGELLOIDES: NOT DETECTED
ROTAVIRUS A: NOT DETECTED
SHIGA LIKE TOXIN PRODUCING E COLI (STEC): NOT DETECTED
Salmonella species: NOT DETECTED
Sapovirus (I, II, IV, and V): NOT DETECTED
Shigella/Enteroinvasive E coli (EIEC): NOT DETECTED
Vibrio cholerae: NOT DETECTED
Vibrio species: NOT DETECTED
Yersinia enterocolitica: NOT DETECTED

## 2016-12-24 ENCOUNTER — Other Ambulatory Visit
Admission: RE | Admit: 2016-12-24 | Discharge: 2016-12-24 | Disposition: A | Discharge status: Home / Self Care | Payer: PRIVATE HEALTH INSURANCE | Source: Ambulatory Visit | Attending: Unknown Physician Specialty | Admitting: Unknown Physician Specialty

## 2016-12-24 DIAGNOSIS — R197 Diarrhea, unspecified: Secondary | ICD-10-CM | POA: Insufficient documentation

## 2016-12-24 LAB — GASTROINTESTINAL PANEL BY PCR, STOOL (REPLACES STOOL CULTURE)

## 2016-12-24 LAB — C DIFFICILE QUICK SCREEN W PCR REFLEX
C DIFFICLE (CDIFF) ANTIGEN: POSITIVE — AB
C Diff toxin: NEGATIVE

## 2016-12-24 LAB — CLOSTRIDIUM DIFFICILE BY PCR: Toxigenic C. Difficile by PCR: POSITIVE — AB

## 2017-01-07 ENCOUNTER — Encounter: Payer: Self-pay | Admitting: *Deleted

## 2017-01-10 ENCOUNTER — Encounter
Admission: RE | Disposition: A | Discharge status: Home / Self Care | Payer: Self-pay | Source: Ambulatory Visit | Attending: Unknown Physician Specialty

## 2017-01-10 ENCOUNTER — Ambulatory Visit: Payer: PRIVATE HEALTH INSURANCE | Admitting: Anesthesiology

## 2017-01-10 ENCOUNTER — Ambulatory Visit
Admission: RE | Admit: 2017-01-10 | Discharge: 2017-01-10 | Disposition: A | Discharge status: Home / Self Care | Payer: PRIVATE HEALTH INSURANCE | Source: Ambulatory Visit | Attending: Unknown Physician Specialty | Admitting: Unknown Physician Specialty

## 2017-01-10 DIAGNOSIS — Z79899 Other long term (current) drug therapy: Secondary | ICD-10-CM | POA: Diagnosis not present

## 2017-01-10 DIAGNOSIS — I10 Essential (primary) hypertension: Secondary | ICD-10-CM | POA: Diagnosis not present

## 2017-01-10 DIAGNOSIS — Z87891 Personal history of nicotine dependence: Secondary | ICD-10-CM | POA: Diagnosis not present

## 2017-01-10 DIAGNOSIS — Z888 Allergy status to other drugs, medicaments and biological substances status: Secondary | ICD-10-CM | POA: Diagnosis not present

## 2017-01-10 DIAGNOSIS — J449 Chronic obstructive pulmonary disease, unspecified: Secondary | ICD-10-CM | POA: Diagnosis not present

## 2017-01-10 DIAGNOSIS — M797 Fibromyalgia: Secondary | ICD-10-CM | POA: Diagnosis not present

## 2017-01-10 DIAGNOSIS — Z880 Allergy status to penicillin: Secondary | ICD-10-CM | POA: Diagnosis not present

## 2017-01-10 DIAGNOSIS — M1990 Unspecified osteoarthritis, unspecified site: Secondary | ICD-10-CM | POA: Insufficient documentation

## 2017-01-10 DIAGNOSIS — Z791 Long term (current) use of non-steroidal anti-inflammatories (NSAID): Secondary | ICD-10-CM | POA: Diagnosis not present

## 2017-01-10 DIAGNOSIS — K648 Other hemorrhoids: Secondary | ICD-10-CM | POA: Insufficient documentation

## 2017-01-10 DIAGNOSIS — A0472 Enterocolitis due to Clostridium difficile, not specified as recurrent: Secondary | ICD-10-CM | POA: Insufficient documentation

## 2017-01-10 HISTORY — PX: FECAL TRANSPLANT: SHX6383

## 2017-01-10 HISTORY — DX: Other cervical disc degeneration, unspecified cervical region: M50.30

## 2017-01-10 HISTORY — PX: COLONOSCOPY WITH PROPOFOL: SHX5780

## 2017-01-10 SURGERY — COLONOSCOPY WITH PROPOFOL
Anesthesia: General

## 2017-01-10 MED ORDER — PROPOFOL 500 MG/50ML IV EMUL
INTRAVENOUS | Status: DC | PRN
  Administered 2017-01-10: 140 ug/kg/min via INTRAVENOUS

## 2017-01-10 MED ORDER — PROPOFOL 10 MG/ML IV BOLUS
INTRAVENOUS | Status: DC | PRN
  Administered 2017-01-10: 100 mg via INTRAVENOUS

## 2017-01-10 MED ORDER — FENTANYL CITRATE (PF) 100 MCG/2ML IJ SOLN
INTRAMUSCULAR | Status: AC
  Filled 2017-01-10: qty 2

## 2017-01-10 MED ORDER — FENTANYL CITRATE (PF) 100 MCG/2ML IJ SOLN
INTRAMUSCULAR | Status: DC | PRN
Start: 2017-01-10 — End: 2017-01-10
  Administered 2017-01-10 (×2): 50 ug via INTRAVENOUS

## 2017-01-10 MED ORDER — LIDOCAINE 2% (20 MG/ML) 5 ML SYRINGE
INTRAMUSCULAR | Status: DC | PRN
  Administered 2017-01-10: 40 mg via INTRAVENOUS

## 2017-01-10 MED ORDER — MIDAZOLAM HCL 2 MG/2ML IJ SOLN
INTRAMUSCULAR | Status: AC
  Filled 2017-01-10: qty 2

## 2017-01-10 MED ORDER — SODIUM CHLORIDE 0.9 % IV SOLN
INTRAVENOUS | Status: DC
  Administered 2017-01-10: 1000 mL via INTRAVENOUS
  Administered 2017-01-10: 11:00:00 via INTRAVENOUS

## 2017-01-10 MED ORDER — MIDAZOLAM HCL 5 MG/5ML IJ SOLN
INTRAMUSCULAR | Status: DC | PRN
  Administered 2017-01-10 (×2): 1 mg via INTRAVENOUS

## 2017-01-10 MED ORDER — SODIUM CHLORIDE 0.9 % IV SOLN: INTRAVENOUS | Status: DC

## 2017-01-10 NOTE — Op Note (Signed)
[facility] Gastroenterology Patient Name: Rose Lloyd Procedure Date: 01/10/2017 11:21 AM MRN: [id number] Account #: [id number] Date of Birth: [birth year] Admit Type: Outpatient Age: 61 Room: [facility] ENDO ROOM 3 Gender: Female Note Status: Finalized Procedure:            Colonoscopy Providers:            Manya Silvas, MD Referring MD:         Leonie Douglas. Doy Hutching, MD (Referring MD) Medicines:            Propofol per Anesthesia Complications:        No immediate complications. Procedure:            Pre-Anesthesia Assessment:                       - After reviewing the risks and benefits, the patient                        was deemed in satisfactory condition to undergo the                        procedure.                       After obtaining informed consent, the colonoscope was                        passed under direct vision. Throughout the procedure,                        the patient's blood pressure, pulse, and oxygen                        saturations were monitored continuously. The                        Colonoscope was introduced through the anus and                        advanced to the the cecum, identified by appendiceal                        orifice and ileocecal valve. The colonoscopy was                        performed without difficulty. The patient tolerated the                        procedure well. The quality of the bowel preparation                        was excellent. Findings:      Internal hemorrhoids were found during endoscopy. The hemorrhoids were       medium-sized and Grade I (internal hemorrhoids that do not prolapse).      Fecal Microbiota Transplant (Bacteriotherapy): Donor stool was prepared       by me donor using milk as per protocol. Doner known to me and is healthy       without diarrhea. Approximately 350 mL of the emulsified donor stool was       instilled in the proximal transverse colon, at the hepatic  flexure, but        mostly in the ascending colon and in the cecum. A detailed colonoscopic       exam could not be performed upon scope withdrawal secondary to limited       visibility from the instilled stool. No active colitis seen on       insertion. There was a film attached to the right colon and transverse       colon which I lavaged off as possible. Impression:           - Internal hemorrhoids.                       - Fecal Microbiota Transplant (Bacteriotherapy)                        performed in the proximal transverse colon, in the                        hepatic flexure, in the ascending colon and in the                        cecum.                       - No specimens collected. Recommendation:       - The findings and recommendations were discussed with                        the patient's family. Manya Silvas, MD 01/10/2017 12:07:21 PM This report has been signed electronically. Number of Addenda: 0 Note Initiated On: 01/10/2017 11:21 AM Scope Withdrawal Time: 0 hours 8 minutes 22 seconds  Total Procedure Duration: 0 hours 19 minutes 59 seconds       [facility]

## 2017-01-10 NOTE — Anesthesia Postprocedure Evaluation (Signed)
Anesthesia Post Note  Patient: Rose Lloyd  Procedure(s) Performed: Procedure(s) (LRB): COLONOSCOPY WITH PROPOFOL (N/A) FECAL TRANSPLANT (N/A)  Patient location during evaluation: PACU Anesthesia Type: General Level of consciousness: awake and alert Pain management: pain level controlled Vital Signs Assessment: post-procedure vital signs reviewed and stable Respiratory status: spontaneous breathing, nonlabored ventilation, respiratory function stable and patient connected to nasal cannula oxygen Cardiovascular status: blood pressure returned to baseline and stable Postop Assessment: no apparent nausea or vomiting Anesthetic complications: no     Last Vitals:  Vitals:   01/10/17 1204 01/10/17 1213  BP: 126/85 (!) 160/85  Pulse: 65 67  Resp: 14 12  Temp:    SpO2: 100% 100%    Last Pain:  Vitals:   01/10/17 1203  TempSrc: Tympanic                 Molli Barrows

## 2017-01-10 NOTE — H&P (Signed)
Primary Care Physician:  Idelle Crouch, MD Primary Gastroenterologist:  Dr. Vira Agar  Pre-Procedure History & Physical: HPI:  Rose Lloyd is a 61 y.o. female is here for an colonoscopy.   Past Medical History:  Diagnosis Date  . Clostridium difficile diarrhea   . COPD (chronic obstructive pulmonary disease) ([place])   . DDD (degenerative disc disease), cervical   . Degenerative disc disease, cervical   . Diverticula of colon   . Fibromyalgia   . Hypertension   . Osteoarthritis     Past Surgical History:  Procedure Laterality Date  . ABDOMINAL HYSTERECTOMY    . COLONOSCOPY N/A 09/02/2014   Procedure: COLONOSCOPY;  Surgeon: Manya Silvas, MD;  Location: [facility] ENDOSCOPY;  Service: Endoscopy;  Laterality: N/A;  . FECAL TRANSPLANT N/A 09/02/2014   Procedure: FECAL TRANSPLANT;  Surgeon: Manya Silvas, MD;  Location: [facility] ENDOSCOPY;  Service: Endoscopy;  Laterality: N/A;    Prior to Admission medications   Medication Sig Start Date End Date Taking? Authorizing Provider  acetaminophen (TYLENOL) 500 MG tablet Take 500 mg by mouth every 6 (six) hours as needed for moderate pain.   Yes [provider]  diclofenac (VOLTAREN) 75 MG EC tablet Take 75 mg by mouth once.   Yes [provider]  dicyclomine (BENTYL) 20 MG tablet Take 1 tablet (20 mg total) by mouth 3 (three) times daily as needed (abdominal pain). 10/16/16  Yes Nance Pear, MD  DULoxetine (CYMBALTA) 60 MG capsule Take 60 mg by mouth daily.   Yes [provider]  lisinopril (PRINIVIL,ZESTRIL) 10 MG tablet Take 10 mg by mouth daily.   Yes [provider]  Probiotic Product (PROBIOTIC DAILY PO) Take 1 capsule by mouth daily.   Yes [provider]  vancomycin (VANCOCIN) 250 MG capsule Take 250 mg by mouth 4 (four) times daily.    [provider]    Allergies as of 01/07/2017 - Review Complete 01/07/2017  Allergen Reaction Noted  . Codeine Nausea And Vomiting  08/26/2014  . Penicillins  04/25/2010    Family History  Problem Relation Age of Onset  . Breast cancer Maternal Grandmother        70's    Social History   Social History  . Marital status: Married    Spouse name: N/A  . Number of children: N/A  . Years of education: N/A   Occupational History  . Not on file.   Social History Main Topics  . Smoking status: Former Smoker    Packs/day: 1.00    Years: 40.00    Types: E-cigarettes    Quit date: 2016  . Smokeless tobacco: Current User  . Alcohol use No  . Drug use: No  . Sexual activity: Not on file   Other Topics Concern  . Not on file   Social History Narrative  . No narrative on file    Review of Systems: See HPI, otherwise negative ROS  Physical Exam: BP (!) 152/59   Pulse 69   Temp 97.9 F (36.6 C) (Tympanic)   Resp 16   Ht 4\' 11"  (1.499 m)   Wt 57.6 kg (127 lb)   SpO2 99%   BMI 25.65 kg/m  General:   Alert,  pleasant and cooperative in NAD Head:  Normocephalic and atraumatic. Neck:  Supple; no masses or thyromegaly. Lungs:  Clear throughout to auscultation.    Heart:  Regular rate and rhythm. Abdomen:  Soft, nontender and nondistended. Normal bowel sounds, without guarding,  and without rebound.   Neurologic:  Alert and  oriented x4;  grossly normal neurologically.  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  Risks, benefits, limitations, and alternatives regarding  colonoscopy have been reviewed with the patient.  Questions have been answered.  All parties agreeable.   Gaylyn Cheers, MD  01/10/2017, 11:16 AM

## 2017-01-10 NOTE — Anesthesia Post-op Follow-up Note (Signed)
Anesthesia QCDR form completed.        

## 2017-01-10 NOTE — Transfer of Care (Signed)
Immediate Anesthesia Transfer of Care Note  Patient: Rose Lloyd  Procedure(s) Performed: Procedure(s): COLONOSCOPY WITH PROPOFOL (N/A) FECAL TRANSPLANT (N/A)  Patient Location: PACU and Endoscopy Unit  Anesthesia Type:General  Level of Consciousness: awake and patient cooperative  Airway & Oxygen Therapy: Patient Spontanous Breathing and Patient connected to nasal cannula oxygen  Post-op Assessment: Report given to RN and Post -op Vital signs reviewed and stable  Post vital signs: Reviewed and stable  Last Vitals:  Vitals:   01/10/17 1027  BP: (!) 152/59  Pulse: 69  Resp: 16  Temp: 36.6 C  SpO2: 99%    Last Pain:  Vitals:   01/10/17 1027  TempSrc: Tympanic         Complications: No apparent anesthesia complications

## 2017-01-10 NOTE — Anesthesia Preprocedure Evaluation (Signed)
Anesthesia Evaluation  Patient identified by MRN, date of birth, ID band Patient awake    Reviewed: Allergy & Precautions, H&P , NPO status , Patient's Chart, lab work & pertinent test results, reviewed documented beta blocker date and time   Airway Mallampati: II   Neck ROM: full    Dental  (+) Poor Dentition   Pulmonary neg pulmonary ROS, former smoker,    Pulmonary exam normal        Cardiovascular hypertension, negative cardio ROS Normal cardiovascular exam Rhythm:regular Rate:Normal     Neuro/Psych PSYCHIATRIC DISORDERS negative neurological ROS  negative psych ROS   GI/Hepatic negative GI ROS, Neg liver ROS,   Endo/Other  negative endocrine ROS  Renal/GU negative Renal ROS  negative genitourinary   Musculoskeletal   Abdominal   Peds  Hematology negative hematology ROS (+)   Anesthesia Other Findings Past Medical History: No date: Clostridium difficile diarrhea No date: COPD (chronic obstructive pulmonary disease) ([facility]) No date: DDD (degenerative disc disease), cervical No date: Degenerative disc disease, cervical No date: Diverticula of colon No date: Fibromyalgia No date: Hypertension No date: Osteoarthritis Past Surgical History: No date: ABDOMINAL HYSTERECTOMY 09/02/2014: COLONOSCOPY; N/A     Comment:  Procedure: COLONOSCOPY;  Surgeon: Manya Silvas, MD;               Location: [facility] ENDOSCOPY;  Service: Endoscopy;                Laterality: N/A; 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; N/A     Comment:  Procedure: FECAL TRANSPLANT;  Surgeon: Manya Silvas,              MD;  Location: [facility] ENDOSCOPY;  Service: Endoscopy;                Laterality: N/A;   Reproductive/Obstetrics negative OB ROS                             Anesthesia Physical Anesthesia Plan  ASA: III  Anesthesia Plan: General   Post-op Pain Management:    Induction:   PONV Risk Score and Plan:    Airway Management Planned:   Additional Equipment:   Intra-op Plan:   Post-operative Plan:   Informed Consent: I have reviewed the patients History and Physical, chart, labs and discussed the procedure including the risks, benefits and alternatives for the proposed anesthesia with the patient or authorized representative who has indicated his/her understanding and acceptance.   Dental Advisory Given  Plan Discussed with: CRNA  Anesthesia Plan Comments:         Anesthesia Quick Evaluation

## 2017-01-11 ENCOUNTER — Encounter: Payer: Self-pay | Admitting: Unknown Physician Specialty

## 2017-03-16 ENCOUNTER — Other Ambulatory Visit: Payer: Self-pay | Admitting: Internal Medicine

## 2017-03-16 DIAGNOSIS — Z1231 Encounter for screening mammogram for malignant neoplasm of breast: Secondary | ICD-10-CM

## 2017-04-15 ENCOUNTER — Ambulatory Visit
Admission: RE | Admit: 2017-04-15 | Discharge: 2017-04-15 | Disposition: A | Discharge status: Home / Self Care | Payer: PRIVATE HEALTH INSURANCE | Source: Ambulatory Visit | Attending: Internal Medicine | Admitting: Internal Medicine

## 2017-04-15 DIAGNOSIS — Z1231 Encounter for screening mammogram for malignant neoplasm of breast: Secondary | ICD-10-CM | POA: Diagnosis not present

## 2017-12-26 ENCOUNTER — Other Ambulatory Visit: Payer: Self-pay | Admitting: Internal Medicine

## 2017-12-26 DIAGNOSIS — G4485 Primary stabbing headache: Secondary | ICD-10-CM

## 2017-12-29 ENCOUNTER — Ambulatory Visit
Admission: RE | Admit: 2017-12-29 | Discharge: 2017-12-29 | Disposition: A | Discharge status: Home / Self Care | Payer: PRIVATE HEALTH INSURANCE | Source: Ambulatory Visit | Attending: Internal Medicine | Admitting: Internal Medicine

## 2017-12-29 DIAGNOSIS — G4485 Primary stabbing headache: Secondary | ICD-10-CM | POA: Diagnosis not present

## 2018-03-14 ENCOUNTER — Other Ambulatory Visit: Payer: Self-pay | Admitting: Internal Medicine

## 2018-03-14 DIAGNOSIS — Z1231 Encounter for screening mammogram for malignant neoplasm of breast: Secondary | ICD-10-CM

## 2018-04-18 ENCOUNTER — Ambulatory Visit
Admission: RE | Admit: 2018-04-18 | Discharge: 2018-04-18 | Disposition: A | Discharge status: Home / Self Care | Payer: PRIVATE HEALTH INSURANCE | Source: Ambulatory Visit | Attending: Internal Medicine | Admitting: Internal Medicine

## 2018-04-18 DIAGNOSIS — Z1231 Encounter for screening mammogram for malignant neoplasm of breast: Secondary | ICD-10-CM | POA: Diagnosis not present

## 2018-05-24 ENCOUNTER — Other Ambulatory Visit: Payer: Self-pay | Admitting: Internal Medicine

## 2018-05-24 DIAGNOSIS — R918 Other nonspecific abnormal finding of lung field: Secondary | ICD-10-CM

## 2018-06-02 ENCOUNTER — Ambulatory Visit
Admission: RE | Admit: 2018-06-02 | Discharge: 2018-06-02 | Disposition: A | Discharge status: Home / Self Care | Payer: PRIVATE HEALTH INSURANCE | Source: Ambulatory Visit | Attending: Internal Medicine | Admitting: Internal Medicine

## 2018-06-02 DIAGNOSIS — R918 Other nonspecific abnormal finding of lung field: Secondary | ICD-10-CM | POA: Diagnosis present

## 2018-09-12 ENCOUNTER — Other Ambulatory Visit: Payer: Self-pay | Admitting: Internal Medicine

## 2018-09-12 DIAGNOSIS — R1011 Right upper quadrant pain: Secondary | ICD-10-CM

## 2018-09-18 ENCOUNTER — Other Ambulatory Visit: Payer: Self-pay

## 2018-09-18 ENCOUNTER — Ambulatory Visit
Admission: RE | Admit: 2018-09-18 | Discharge: 2018-09-18 | Disposition: A | Discharge status: Home / Self Care | Payer: PRIVATE HEALTH INSURANCE | Source: Ambulatory Visit | Attending: Internal Medicine | Admitting: Internal Medicine

## 2018-09-18 DIAGNOSIS — R1011 Right upper quadrant pain: Secondary | ICD-10-CM | POA: Diagnosis not present

## 2018-12-21 ENCOUNTER — Other Ambulatory Visit: Payer: Self-pay

## 2018-12-21 DIAGNOSIS — Z20822 Contact with and (suspected) exposure to covid-19: Secondary | ICD-10-CM

## 2018-12-22 LAB — NOVEL CORONAVIRUS, NAA: SARS-CoV-2, NAA: NOT DETECTED

## 2019-01-10 ENCOUNTER — Other Ambulatory Visit: Payer: Self-pay | Admitting: Orthopedic Surgery

## 2019-01-10 DIAGNOSIS — M67431 Ganglion, right wrist: Secondary | ICD-10-CM

## 2019-01-10 DIAGNOSIS — G5601 Carpal tunnel syndrome, right upper limb: Secondary | ICD-10-CM

## 2019-01-10 DIAGNOSIS — M25531 Pain in right wrist: Secondary | ICD-10-CM

## 2019-01-23 ENCOUNTER — Other Ambulatory Visit: Payer: Self-pay

## 2019-01-23 ENCOUNTER — Ambulatory Visit
Admission: RE | Admit: 2019-01-23 | Discharge: 2019-01-23 | Disposition: A | Discharge status: Home / Self Care | Payer: PRIVATE HEALTH INSURANCE | Source: Ambulatory Visit | Attending: Orthopedic Surgery | Admitting: Orthopedic Surgery

## 2019-01-23 DIAGNOSIS — G5601 Carpal tunnel syndrome, right upper limb: Secondary | ICD-10-CM | POA: Insufficient documentation

## 2019-01-23 DIAGNOSIS — M25531 Pain in right wrist: Secondary | ICD-10-CM | POA: Insufficient documentation

## 2019-01-23 DIAGNOSIS — M67431 Ganglion, right wrist: Secondary | ICD-10-CM | POA: Insufficient documentation

## 2019-05-25 ENCOUNTER — Other Ambulatory Visit: Payer: Self-pay | Admitting: Internal Medicine

## 2019-05-25 DIAGNOSIS — Z1231 Encounter for screening mammogram for malignant neoplasm of breast: Secondary | ICD-10-CM

## 2019-06-15 ENCOUNTER — Ambulatory Visit
Admission: RE | Admit: 2019-06-15 | Discharge: 2019-06-15 | Disposition: A | Discharge status: Home / Self Care | Payer: Medicare HMO | Source: Ambulatory Visit | Attending: Internal Medicine | Admitting: Internal Medicine

## 2019-06-15 DIAGNOSIS — Z1231 Encounter for screening mammogram for malignant neoplasm of breast: Secondary | ICD-10-CM | POA: Diagnosis not present

## 2019-06-19 ENCOUNTER — Other Ambulatory Visit: Payer: Self-pay | Admitting: Orthopedic Surgery

## 2019-06-25 ENCOUNTER — Encounter
Admission: RE | Admit: 2019-06-25 | Discharge: 2019-06-25 | Disposition: A | Discharge status: Home / Self Care | Payer: Medicare HMO | Source: Ambulatory Visit | Attending: Orthopedic Surgery | Admitting: Orthopedic Surgery

## 2019-06-25 ENCOUNTER — Other Ambulatory Visit: Payer: Self-pay

## 2019-06-25 DIAGNOSIS — Z01818 Encounter for other preprocedural examination: Secondary | ICD-10-CM | POA: Insufficient documentation

## 2019-06-25 DIAGNOSIS — R9431 Abnormal electrocardiogram [ECG] [EKG]: Secondary | ICD-10-CM | POA: Diagnosis not present

## 2019-06-25 DIAGNOSIS — Z20822 Contact with and (suspected) exposure to covid-19: Secondary | ICD-10-CM | POA: Insufficient documentation

## 2019-06-25 NOTE — Patient Instructions (Signed)
Your procedure is scheduled on: Thursday June 28, 2019 Report to Day Surgery. To find out your arrival time please call [phone] between 1PM - 3PM on Wednesday June 27, 2019.  Remember: Instructions that are not followed completely may result in serious medical risk,  up to and including death, or upon the discretion of your surgeon and anesthesiologist your  surgery may need to be rescheduled.     _X__ 1. Do not eat food after midnight the night before your procedure.                 No gum chewing or hard candies. You may drink clear liquids up to 2 hours                 before you are scheduled to arrive for your surgery- DO not drink clear                 liquids within 2 hours of the start of your surgery.                 Clear Liquids include:  water, apple juice without pulp, clear Gatorade, G2 or                  Gatorade Zero (avoid Red/Purple/Blue), Black Coffee or Tea (Do not add                 anything to coffee or tea). __x___2.   Complete the carbohydrate drink provided to you, 2 hours before arrival.  __X__2.  On the morning of surgery brush your teeth with toothpaste and water, you                may rinse your mouth with mouthwash if you wish.  Do not swallow any toothpaste of mouthwash.     _X__ 3.  No Alcohol for 24 hours before or after surgery.   _X__ 4.  Do Not Smoke or use e-cigarettes For 24 Hours Prior to Your Surgery.                 Do not use any chewable tobacco products for at least 6 hours prior to                 surgery.   __x__  6.  Notify your doctor if there is any change in your medical condition      (cold, fever, infections).     Do not wear jewelry, make-up, hairpins, clips or nail polish. Do not wear lotions, powders, or perfumes. You may wear deodorant. Do not shave 48 hours prior to surgery. Men may shave face and neck. Do not bring valuables to the hospital.    [facility] is not responsible for any  belongings or valuables.  Contacts, dentures or bridgework may not be worn into surgery. Leave your suitcase in the car. After surgery it may be brought to your room. For patients admitted to the hospital, discharge time is determined by your treatment team.   Patients discharged the day of surgery will not be allowed to drive home.   Make arrangements for someone to be with you for the first 24 hours of your Same Day Discharge.   __x__ Take these medicines the morning of surgery with A SIP OF WATER:    1. acetaminophen (TYLENOL  2. DULoxetine (CYMBALTA)   __x__ Use CHG Soap as directed  __x__ Use inhalers on the day of surgery triamcinolone (NASACORT ALLERGY 24HR)  if needed   __x__ Stop Anti-inflammatories such as diclofenac (VOLTAREN), ibuprofen, aspirin, naproxen, Aleve and or BC powders   __x__ Stop supplements until after surgery.    __x__ Do not start any herbal supplements before your surgery.

## 2019-06-26 ENCOUNTER — Encounter
Admission: RE | Admit: 2019-06-26 | Discharge: 2019-06-26 | Disposition: A | Discharge status: Home / Self Care | Payer: Medicare HMO | Source: Ambulatory Visit | Attending: Orthopedic Surgery | Admitting: Orthopedic Surgery

## 2019-06-26 DIAGNOSIS — Z01818 Encounter for other preprocedural examination: Secondary | ICD-10-CM | POA: Diagnosis not present

## 2019-06-26 LAB — SARS CORONAVIRUS 2 (TAT 6-24 HRS): SARS Coronavirus 2: NEGATIVE

## 2019-06-26 NOTE — Pre-Procedure Instructions (Signed)
Pre-Admit Testing Provider Note  Provider Notified: Anesthesiologist ( Dr. Andree Elk)  Notification Mode: Secure chat   Reason: EKG results   can you look at this EKG, no previous EKG on chat. patient denies palpitations or heart problems. procedure is [employer]. thanks, Breindel Collier, RN  Response: if exercise tolerance is good and without anginal equivalent, ok to proceed. JA   Additional Information: none   Signed: Rebecca Eaton, RN

## 2019-06-28 ENCOUNTER — Other Ambulatory Visit: Payer: Self-pay

## 2019-06-28 ENCOUNTER — Encounter
Admission: RE | Disposition: A | Discharge status: Home / Self Care | Payer: Self-pay | Source: Home / Self Care | Attending: Orthopedic Surgery

## 2019-06-28 ENCOUNTER — Ambulatory Visit: Payer: Medicare HMO | Admitting: Anesthesiology

## 2019-06-28 ENCOUNTER — Encounter: Payer: Self-pay | Admitting: Orthopedic Surgery

## 2019-06-28 ENCOUNTER — Ambulatory Visit
Admission: RE | Admit: 2019-06-28 | Discharge: 2019-06-28 | Disposition: A | Discharge status: Home / Self Care | Payer: Medicare HMO | Attending: Orthopedic Surgery | Admitting: Orthopedic Surgery

## 2019-06-28 DIAGNOSIS — Z791 Long term (current) use of non-steroidal anti-inflammatories (NSAID): Secondary | ICD-10-CM | POA: Diagnosis not present

## 2019-06-28 DIAGNOSIS — I10 Essential (primary) hypertension: Secondary | ICD-10-CM | POA: Diagnosis not present

## 2019-06-28 DIAGNOSIS — M6588 Other synovitis and tenosynovitis, other site: Secondary | ICD-10-CM | POA: Diagnosis present

## 2019-06-28 DIAGNOSIS — Z79899 Other long term (current) drug therapy: Secondary | ICD-10-CM | POA: Insufficient documentation

## 2019-06-28 DIAGNOSIS — J449 Chronic obstructive pulmonary disease, unspecified: Secondary | ICD-10-CM | POA: Diagnosis not present

## 2019-06-28 DIAGNOSIS — M797 Fibromyalgia: Secondary | ICD-10-CM | POA: Insufficient documentation

## 2019-06-28 DIAGNOSIS — F329 Major depressive disorder, single episode, unspecified: Secondary | ICD-10-CM | POA: Diagnosis not present

## 2019-06-28 HISTORY — PX: FLEXOR TENDON REPAIR: SHX6501

## 2019-06-28 SURGERY — REPAIR, TENDON, FLEXOR
Anesthesia: General | Site: Wrist | Laterality: Right

## 2019-06-28 MED ORDER — METOCLOPRAMIDE HCL 5 MG/ML IJ SOLN: 5.0000 mg | Freq: Three times a day (TID) | INTRAMUSCULAR | Status: DC | PRN

## 2019-06-28 MED ORDER — MIDAZOLAM HCL 2 MG/2ML IJ SOLN
INTRAMUSCULAR | Status: AC
  Filled 2019-06-28: qty 2

## 2019-06-28 MED ORDER — FAMOTIDINE 20 MG PO TABS
ORAL_TABLET | ORAL | Status: AC
  Administered 2019-06-28: 20 mg via ORAL
  Filled 2019-06-28: qty 1

## 2019-06-28 MED ORDER — DEXAMETHASONE SODIUM PHOSPHATE 10 MG/ML IJ SOLN
INTRAMUSCULAR | Status: DC | PRN
  Administered 2019-06-28: 10 mg via INTRAVENOUS

## 2019-06-28 MED ORDER — FENTANYL CITRATE (PF) 100 MCG/2ML IJ SOLN
INTRAMUSCULAR | Status: AC
  Filled 2019-06-28: qty 2

## 2019-06-28 MED ORDER — KETOROLAC TROMETHAMINE 30 MG/ML IJ SOLN
INTRAMUSCULAR | Status: DC | PRN
  Administered 2019-06-28: 30 mg via INTRAVENOUS

## 2019-06-28 MED ORDER — HYDROCODONE-ACETAMINOPHEN 5-325 MG PO TABS: 1.0000 | ORAL_TABLET | Freq: Four times a day (QID) | ORAL | 0 refills | Status: AC | PRN

## 2019-06-28 MED ORDER — SODIUM CHLORIDE 0.9 % IV SOLN: INTRAVENOUS | Status: DC

## 2019-06-28 MED ORDER — FENTANYL CITRATE (PF) 100 MCG/2ML IJ SOLN: 25.0000 ug | INTRAMUSCULAR | Status: DC | PRN

## 2019-06-28 MED ORDER — PROPOFOL 10 MG/ML IV BOLUS
INTRAVENOUS | Status: DC | PRN
  Administered 2019-06-28: 130 mg via INTRAVENOUS

## 2019-06-28 MED ORDER — CLINDAMYCIN PHOSPHATE 900 MG/50ML IV SOLN: 900.0000 mg | INTRAVENOUS | Status: DC

## 2019-06-28 MED ORDER — PROMETHAZINE HCL 25 MG/ML IJ SOLN: 6.2500 mg | INTRAMUSCULAR | Status: DC | PRN

## 2019-06-28 MED ORDER — CLINDAMYCIN PHOSPHATE 900 MG/50ML IV SOLN
INTRAVENOUS | Status: AC
  Filled 2019-06-28: qty 50

## 2019-06-28 MED ORDER — FAMOTIDINE 20 MG PO TABS: 20.0000 mg | ORAL_TABLET | Freq: Once | ORAL | Status: AC

## 2019-06-28 MED ORDER — GLYCOPYRROLATE 0.2 MG/ML IJ SOLN
INTRAMUSCULAR | Status: DC | PRN
  Administered 2019-06-28: .2 mg via INTRAVENOUS

## 2019-06-28 MED ORDER — HYDROCODONE-ACETAMINOPHEN 5-325 MG PO TABS
1.0000 | ORAL_TABLET | Freq: Four times a day (QID) | ORAL | Status: DC | PRN
  Administered 2019-06-28: 15:00:00 1 via ORAL

## 2019-06-28 MED ORDER — ONDANSETRON HCL 4 MG/2ML IJ SOLN
INTRAMUSCULAR | Status: DC | PRN
  Administered 2019-06-28: 4 mg via INTRAVENOUS

## 2019-06-28 MED ORDER — BUPIVACAINE HCL 0.5 % IJ SOLN
INTRAMUSCULAR | Status: DC | PRN
  Administered 2019-06-28: 10 mL

## 2019-06-28 MED ORDER — ONDANSETRON HCL 4 MG PO TABS: 4.0000 mg | ORAL_TABLET | Freq: Four times a day (QID) | ORAL | Status: DC | PRN

## 2019-06-28 MED ORDER — FENTANYL CITRATE (PF) 100 MCG/2ML IJ SOLN
INTRAMUSCULAR | Status: DC | PRN
  Administered 2019-06-28 (×2): 25 ug via INTRAVENOUS

## 2019-06-28 MED ORDER — ONDANSETRON HCL 4 MG/2ML IJ SOLN: 4.0000 mg | Freq: Four times a day (QID) | INTRAMUSCULAR | Status: DC | PRN

## 2019-06-28 MED ORDER — HYDROCODONE-ACETAMINOPHEN 5-325 MG PO TABS
ORAL_TABLET | ORAL | Status: AC
  Filled 2019-06-28: qty 1

## 2019-06-28 MED ORDER — LACTATED RINGERS IV SOLN: INTRAVENOUS | Status: DC

## 2019-06-28 MED ORDER — MIDAZOLAM HCL 2 MG/2ML IJ SOLN
INTRAMUSCULAR | Status: DC | PRN
  Administered 2019-06-28: 2 mg via INTRAVENOUS

## 2019-06-28 MED ORDER — LIDOCAINE HCL (CARDIAC) PF 100 MG/5ML IV SOSY
PREFILLED_SYRINGE | INTRAVENOUS | Status: DC | PRN
  Administered 2019-06-28: 50 mg via INTRAVENOUS

## 2019-06-28 MED ORDER — CHLORHEXIDINE GLUCONATE 4 % EX LIQD
60.0000 mL | Freq: Once | CUTANEOUS | Status: AC
  Administered 2019-06-28: 4 via TOPICAL

## 2019-06-28 MED ORDER — PHENYLEPHRINE HCL (PRESSORS) 10 MG/ML IV SOLN
INTRAVENOUS | Status: DC | PRN
  Administered 2019-06-28: 200 ug via INTRAVENOUS

## 2019-06-28 MED ORDER — METOCLOPRAMIDE HCL 10 MG PO TABS: 5.0000 mg | ORAL_TABLET | Freq: Three times a day (TID) | ORAL | Status: DC | PRN

## 2019-06-28 SURGICAL SUPPLY — 26 items
BNDG ELASTIC 3X5.8 VLCR STR LF (GAUZE/BANDAGES/DRESSINGS) ×3 IMPLANT
CANISTER SUCT 1200ML W/VALVE (MISCELLANEOUS) ×3 IMPLANT
CHLORAPREP W/TINT 26 (MISCELLANEOUS) ×3 IMPLANT
COVER WAND RF STERILE (DRAPES) ×3 IMPLANT
CUFF TOURN SGL QUICK 18X4 (TOURNIQUET CUFF) ×2 IMPLANT
ELECT CAUTERY NDL 2.0 MIC (NEEDLE) IMPLANT
ELECT CAUTERY NEEDLE 2.0 MIC (NEEDLE) IMPLANT
GAUZE SPONGE 4X4 12PLY STRL (GAUZE/BANDAGES/DRESSINGS) ×3 IMPLANT
GAUZE XEROFORM 1X8 LF (GAUZE/BANDAGES/DRESSINGS) ×3 IMPLANT
GLOVE SURG SYN 9.0  PF PI (GLOVE) ×2
GLOVE SURG SYN 9.0 PF PI (GLOVE) ×1 IMPLANT
GOWN SRG 2XL LVL 4 RGLN SLV (GOWNS) ×1 IMPLANT
GOWN STRL NON-REIN 2XL LVL4 (GOWNS) ×2
GOWN STRL REUS W/ TWL LRG LVL3 (GOWN DISPOSABLE) ×1 IMPLANT
GOWN STRL REUS W/TWL LRG LVL3 (GOWN DISPOSABLE) ×2
KIT TURNOVER KIT A (KITS) ×3 IMPLANT
NS IRRIG 500ML POUR BTL (IV SOLUTION) ×3 IMPLANT
PACK EXTREMITY ARMC (MISCELLANEOUS) ×3 IMPLANT
PAD CAST CTTN 4X4 STRL (SOFTGOODS) ×1 IMPLANT
PADDING CAST COTTON 4X4 STRL (SOFTGOODS) ×2
SCALPEL PROTECTED #15 DISP (BLADE) ×6 IMPLANT
SPLINT CAST 1 STEP 3X12 (MISCELLANEOUS) ×2 IMPLANT
SUT ETHILON 4-0 (SUTURE) ×2
SUT ETHILON 4-0 FS2 18XMFL BLK (SUTURE) ×1
SUT VICRYL AB 3-0 FS1 BRD 27IN (SUTURE) ×2 IMPLANT
SUTURE ETHLN 4-0 FS2 18XMF BLK (SUTURE) ×1 IMPLANT

## 2019-06-28 NOTE — Anesthesia Postprocedure Evaluation (Signed)
Anesthesia Post Note  Patient: Rose Lloyd  Procedure(s) Performed: RIGHT FCR FLOXOR TENOSYNOVECTOMY AND REPAIR (Right Wrist)  Patient location during evaluation: PACU Anesthesia Type: General Level of consciousness: awake and alert Pain management: pain level controlled Vital Signs Assessment: post-procedure vital signs reviewed and stable Respiratory status: spontaneous breathing, nonlabored ventilation, respiratory function stable and patient connected to nasal cannula oxygen Cardiovascular status: blood pressure returned to baseline and stable Postop Assessment: no apparent nausea or vomiting Anesthetic complications: no     Last Vitals:  Vitals:   06/28/19 1512 06/28/19 1522  BP:  140/65  Pulse: 82 70  Resp: 13 18  Temp:  36.4 C  SpO2: 96% 100%    Last Pain:  Vitals:   06/28/19 1522  TempSrc: Temporal  PainSc: 2                  Martha Clan

## 2019-06-28 NOTE — Transfer of Care (Signed)
Immediate Anesthesia Transfer of Care Note  Patient: Rose Lloyd  Procedure(s) Performed: RIGHT FCR FLOXOR TENOSYNOVECTOMY AND REPAIR (Right Wrist)  Patient Location: PACU  Anesthesia Type:General  Level of Consciousness: awake, alert  and patient cooperative  Airway & Oxygen Therapy: Patient Spontanous Breathing and Patient connected to face mask oxygen  Post-op Assessment: Report given to RN and Post -op Vital signs reviewed and stable  Post vital signs: Reviewed and stable  Last Vitals:  Vitals Value Taken Time  BP 123/70 06/28/19 1436  Temp 36.2 C 06/28/19 1435  Pulse 90 06/28/19 1437  Resp 14 06/28/19 1437  SpO2 100 % 06/28/19 1437  Vitals shown include unvalidated device data.  Last Pain:  Vitals:   06/28/19 1121  TempSrc: Temporal  PainSc: 0-No pain         Complications: No apparent anesthesia complications

## 2019-06-28 NOTE — Op Note (Signed)
06/28/2019  2:37 PM  PATIENT:  Rose Lloyd  64 y.o. female  PRE-OPERATIVE DIAGNOSIS:  FCR TENOSYNOVITIS  POST-OPERATIVE DIAGNOSIS:  FCR TENOSYNOVITIS  PROCEDURE:  Procedure(s): RIGHT FCR FLOXOR TENOSYNOVECTOMY AND REPAIR (Right)  SURGEON: Laurene Footman, MD  ASSISTANTS: None  ANESTHESIA:   general  EBL:  Total I/O In: 800 [I.V.:800] Out: -   BLOOD ADMINISTERED:none  DRAINS: none   LOCAL MEDICATIONS USED:  MARCAINE     SPECIMEN:  No Specimen  DISPOSITION OF SPECIMEN:  N/A  COUNTS:  YES  TOURNIQUET:   Total Tourniquet Time Documented: Upper Arm (Right) - 20 minutes Total: Upper Arm (Right) - 20 minutes   IMPLANTS: None  DICTATION: .Dragon Dictation patient was brought to the operating room and after adequate general anesthesia was obtained the right arm was prepped and draped in the usual sterile fashion.  After patient identification and timeout procedures were completed the tourniquet was raised.  A volar approach was made centered over the FCR tendon proximally from the level of the radial styloid proximally about 3-1/2 cm the skin and subcutaneous tissue was divided and the tendon sheath except exposed and was noted to be quite thickened.  The extent the flexor tendon sheath was then excised and the tendon examined.  Within the port portions of the distal tendon there was obviously degenerated tendon with a grayish appearance.  This was excised with sharp dissection with a scalpel leaving healthy tendon.  There was a longitudinal split as well within the tendon and a 3-0 Vicryl suture was woven through to aid in healing.  Following this on range of motion it appeared to be stable with no additional inflammatory tissue present no ganglion cyst was identified.  The wound was irrigated and then closed after infiltration of 10 cc of half percent Sensorcaine the wound was closed with simple interrupted 4-0 nylon followed by Xeroform 4 x 4 web roll volar splint and Ace wrap  there are no complications  PLAN OF CARE: Discharge to home after PACU  PATIENT DISPOSITION:  PACU - hemodynamically stable.

## 2019-06-28 NOTE — Discharge Instructions (Addendum)
AMBULATORY SURGERY  DISCHARGE INSTRUCTIONS   1) The drugs that you were given will stay in your system until tomorrow so for the next 24 hours you should not:  A) Drive an automobile B) Make any legal decisions C) Drink any alcoholic beverage   2) You may resume regular meals tomorrow.  Today it is better to start with liquids and gradually work up to solid foods.  You may eat anything you prefer, but it is better to start with liquids, then soup and crackers, and gradually work up to solid foods.   3) Please notify your doctor immediately if you have any unusual bleeding, trouble breathing, redness and pain at the surgery site, drainage, fever, or pain not relieved by medication.  4) Your post-operative visit with Dr.                                     is: Date:                        Time:    Please call to schedule your post-operative visit.  5) Additional Instructions: Keep arm elevated is much as you can.  Loosen Ace wrap if your pain is severe. Pain medicine as directed.  Work fingers is much as you can. Call office if you are having problems Keep splint clean and dry.

## 2019-06-28 NOTE — Anesthesia Preprocedure Evaluation (Signed)
Anesthesia Evaluation  Patient identified by MRN, date of birth, ID band Patient awake    Reviewed: Allergy & Precautions, H&P , NPO status , Patient's Chart, lab work & pertinent test results, reviewed documented beta blocker date and time   History of Anesthesia Complications Negative for: history of anesthetic complications  Airway Mallampati: III   Neck ROM: full    Dental  (+) Poor Dentition, Dental Advidsory Given, Caps, Missing, Teeth Intact   Pulmonary neg shortness of breath, COPD, neg recent URI, former smoker,    Pulmonary exam normal        Cardiovascular hypertension, (-) angina(-) Past MI and (-) Cardiac Stents Normal cardiovascular exam(-) dysrhythmias (-) Valvular Problems/Murmurs Rhythm:regular Rate:Normal     Neuro/Psych PSYCHIATRIC DISORDERS Depression negative neurological ROS     GI/Hepatic negative GI ROS, Neg liver ROS,   Endo/Other  negative endocrine ROS  Renal/GU negative Renal ROS  negative genitourinary   Musculoskeletal   Abdominal   Peds  Hematology negative hematology ROS (+)   Anesthesia Other Findings Past Medical History: No date: Clostridium difficile diarrhea No date: COPD (chronic obstructive pulmonary disease) (HCC) No date: DDD (degenerative disc disease), cervical No date: Degenerative disc disease, cervical No date: Diverticula of colon No date: Fibromyalgia No date: Hypertension No date: Osteoarthritis Past Surgical History: No date: ABDOMINAL HYSTERECTOMY 09/02/2014: COLONOSCOPY; N/A     Comment:  Procedure: COLONOSCOPY;  Surgeon: Manya Silvas, MD;               Location: [facility] ENDOSCOPY;  Service: Endoscopy;                Laterality: N/A; 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; N/A     Comment:  Procedure: FECAL TRANSPLANT;  Surgeon: Manya Silvas,              MD;  Location: [facility] ENDOSCOPY;  Service: Endoscopy;                Laterality: N/A;   Reproductive/Obstetrics negative OB ROS                             Anesthesia Physical  Anesthesia Plan  ASA: II  Anesthesia Plan: General   Post-op Pain Management:    Induction: Intravenous  PONV Risk Score and Plan: 3 and Ondansetron, Dexamethasone, Midazolam and Promethazine  Airway Management Planned: LMA  Additional Equipment:   Intra-op Plan:   Post-operative Plan: Extubation in OR  Informed Consent: I have reviewed the patients History and Physical, chart, labs and discussed the procedure including the risks, benefits and alternatives for the proposed anesthesia with the patient or authorized representative who has indicated his/her understanding and acceptance.     Dental Advisory Given  Plan Discussed with: CRNA  Anesthesia Plan Comments:         Anesthesia Quick Evaluation

## 2019-06-28 NOTE — Anesthesia Procedure Notes (Addendum)
Procedure Name: LMA Insertion Date/Time: 06/28/2019 1:55 PM Performed by: Caryl Asp, CRNA Pre-anesthesia Checklist: Patient identified, Patient being monitored, Timeout performed, Emergency Drugs available and Suction available Patient Re-evaluated:Patient Re-evaluated prior to induction Oxygen Delivery Method: Circle system utilized Preoxygenation: Pre-oxygenation with 100% oxygen Induction Type: IV induction Ventilation: Mask ventilation without difficulty LMA: LMA inserted LMA Size: 3.0 Tube type: Oral Number of attempts: 1 Placement Confirmation: positive ETCO2 and breath sounds checked- equal and bilateral Tube secured with: Tape Dental Injury: Teeth and Oropharynx as per pre-operative assessment

## 2019-06-28 NOTE — H&P (Signed)
Reviewed paper H+P, will be scanned into chart. No changes noted.  

## 2019-07-26 ENCOUNTER — Ambulatory Visit: Payer: Medicare HMO | Attending: Internal Medicine

## 2019-07-26 DIAGNOSIS — Z23 Encounter for immunization: Secondary | ICD-10-CM

## 2019-07-26 NOTE — Progress Notes (Signed)
   Covid-19 Vaccination Clinic  Name:  Rose Lloyd    MRN: [id number] DOB: [birth year]  07/26/2019  [name] was observed post Covid-19 immunization for 15 minutes without incident. She was provided with Vaccine Information Sheet and instruction to access the V-Safe system.   [name] was instructed to call 911 with any severe reactions post vaccine: Marland Kitchen Difficulty breathing  . Swelling of face and throat  . A fast heartbeat  . A bad rash all over body  . Dizziness and weakness   Immunizations Administered    Name Date Dose VIS Date Route   Pfizer COVID-19 Vaccine 07/26/2019 10:44 AM 0.3 mL 03/30/2019 Intramuscular   Manufacturer: [place]   Lot: [id number]   [place]: [id number]

## 2019-08-21 ENCOUNTER — Ambulatory Visit: Payer: Medicare HMO | Attending: Internal Medicine

## 2019-08-21 DIAGNOSIS — Z23 Encounter for immunization: Secondary | ICD-10-CM

## 2019-08-21 NOTE — Progress Notes (Signed)
   Covid-19 Vaccination Clinic  Name:  Rose Lloyd    MRN: [id number] DOB: 1955/08/06  08/21/2019  [name] was observed post Covid-19 immunization for 15 minutes without incident. She was provided with Vaccine Information Sheet and instruction to access the V-Safe system.   [name] was instructed to call 911 with any severe reactions post vaccine: Marland Kitchen Difficulty breathing  . Swelling of face and throat  . A fast heartbeat  . A bad rash all over body  . Dizziness and weakness   Immunizations Administered    Name Date Dose VIS Date Route   Pfizer COVID-19 Vaccine 08/21/2019 10:36 AM 0.3 mL 06/13/2018 Intramuscular   Manufacturer: [place]   Lot: [id number]   [place]: [id number]

## 2020-11-15 IMAGING — MG DIGITAL SCREENING BILAT W/ TOMO W/ CAD
8 series · 8 of 24 positions shown · non-contrast
Comparison: Previous exam(s).

CLINICAL DATA: Screening.

EXAM:
DIGITAL SCREENING BILATERAL MAMMOGRAM WITH TOMO AND CAD

[L MLO synth-2D]
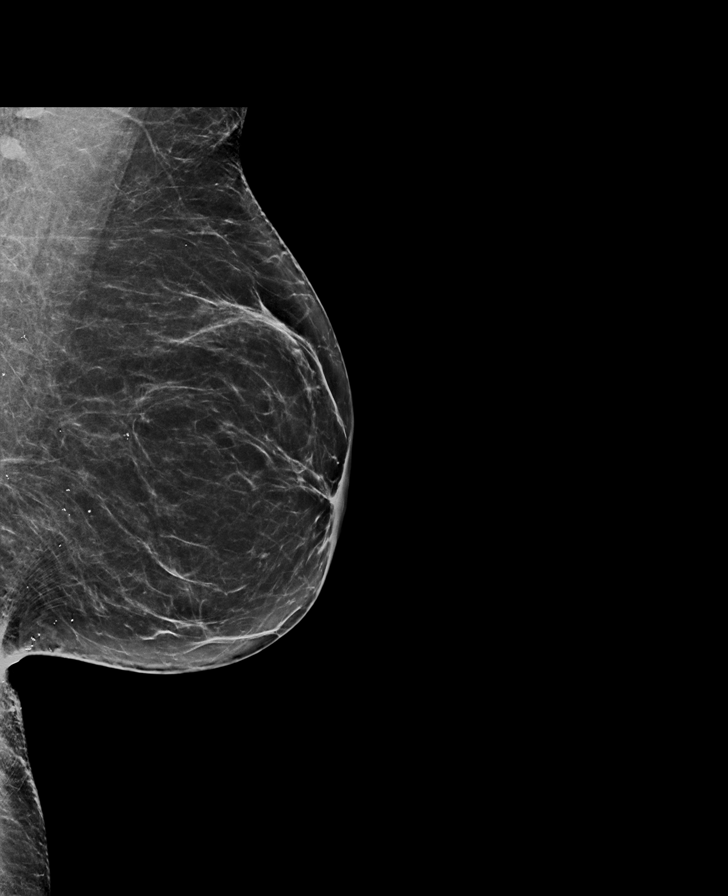

[R CC synth-2D]
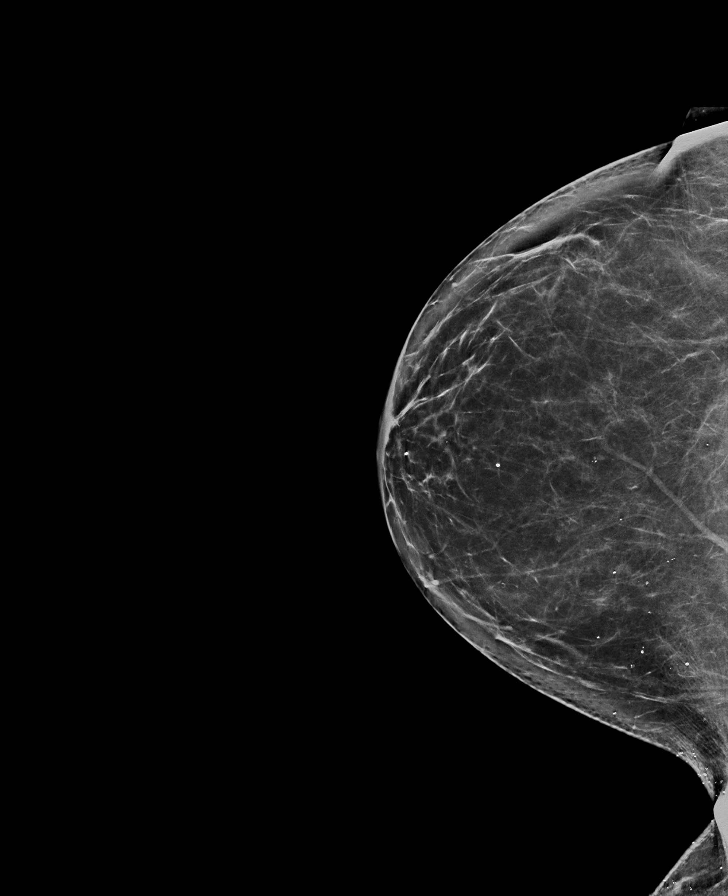

[R MLO synth-2D]
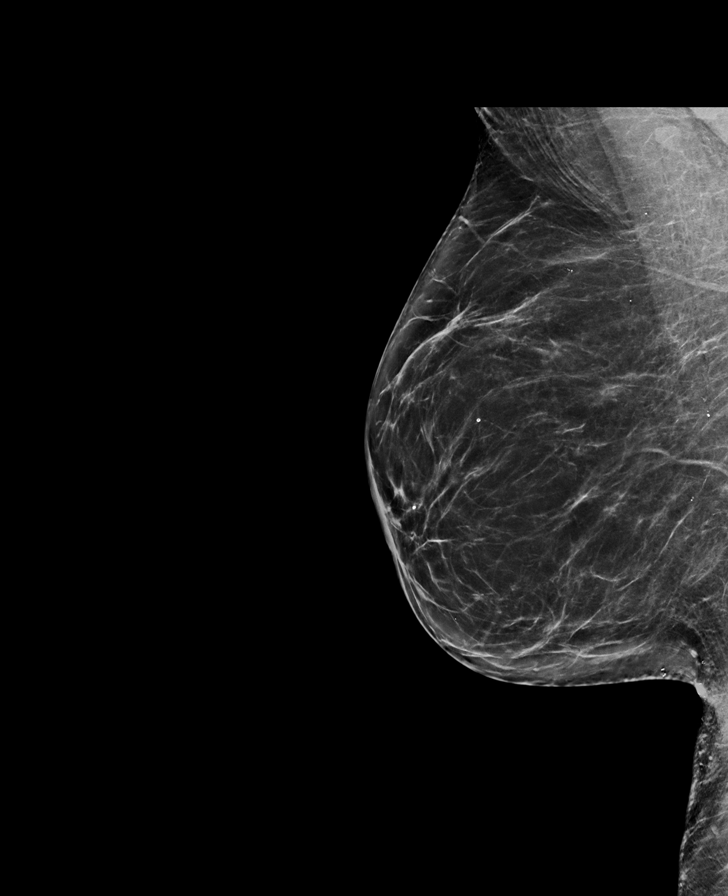

[L CC synth-2D]
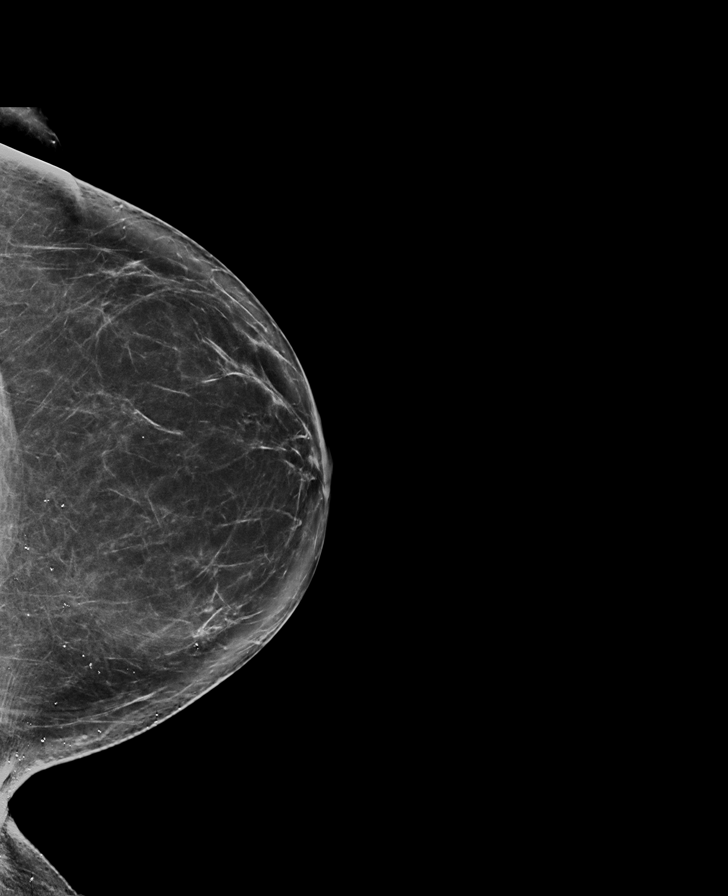

[R CC tomo · tomo slice 38/75.0]
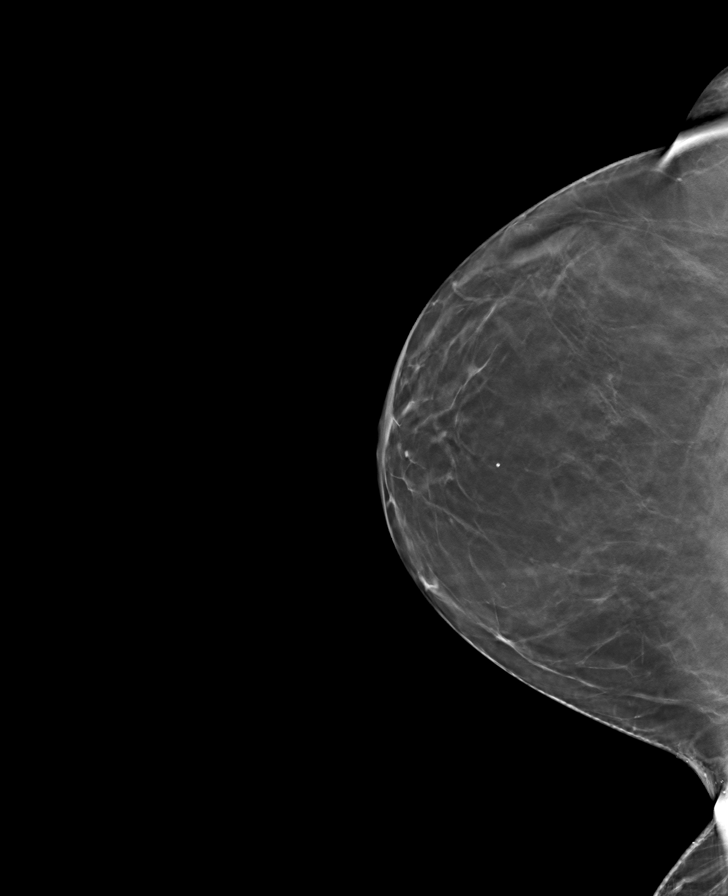

[L CC tomo · tomo slice 39/77.0]
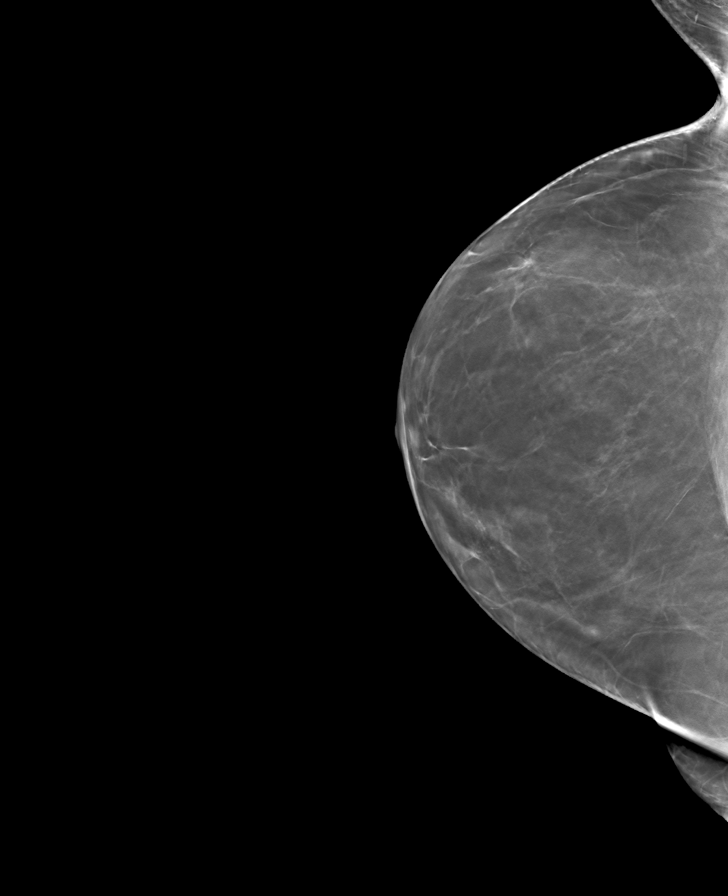

[L MLO tomo · tomo slice 37/74.0]
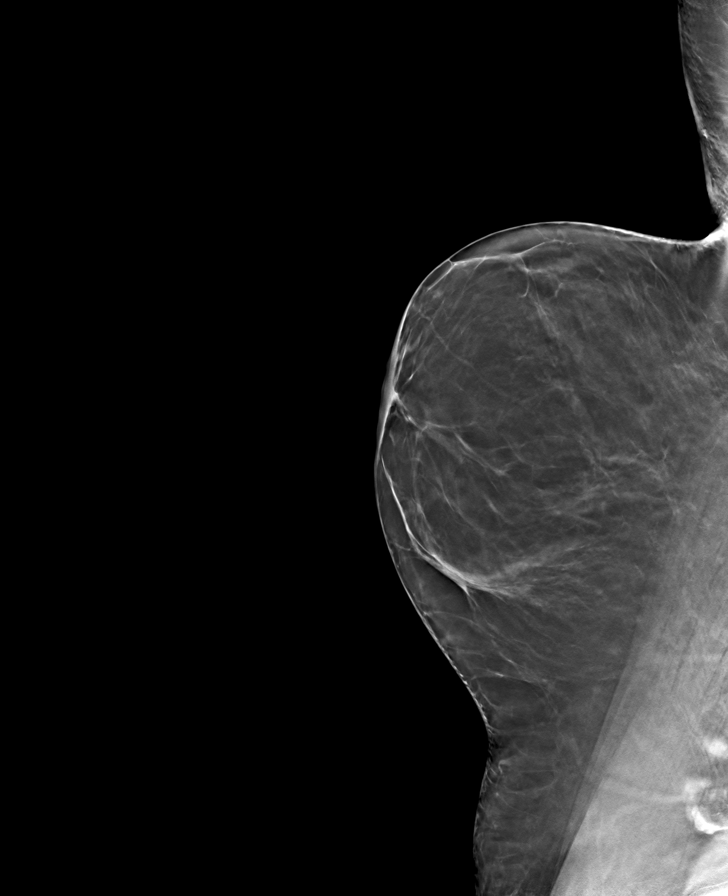

[R MLO tomo · tomo slice 39/77.0]
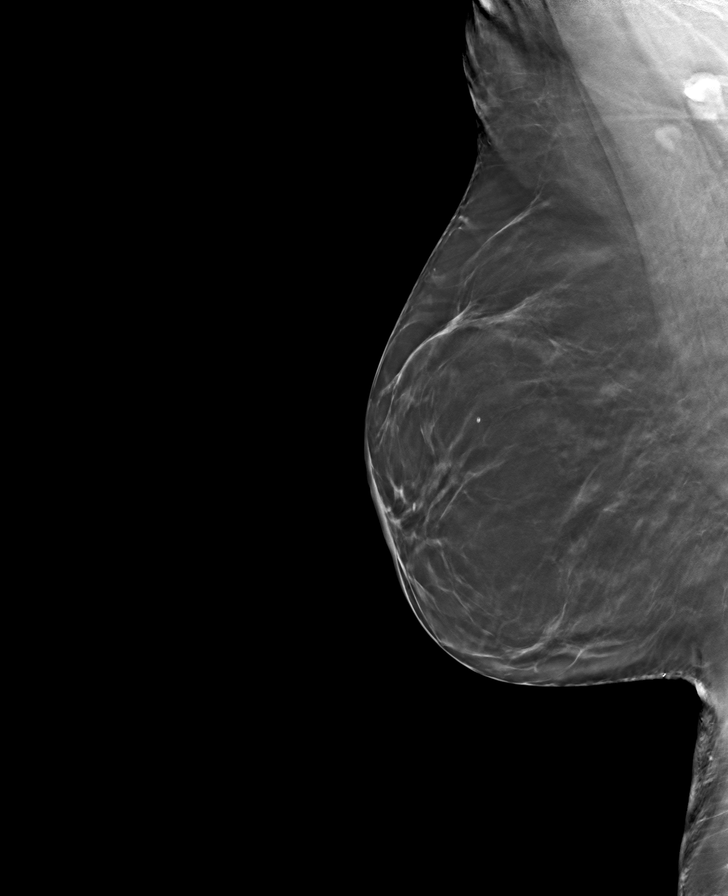

[8 of 24 positions shown; findings below may reference images not displayed]

ACR Breast Density Category b: There are scattered areas of
fibroglandular density.
FINDINGS: There are no findings suspicious for malignancy. Images were
processed with CAD.
IMPRESSION: No mammographic evidence of malignancy. A result letter of this
screening mammogram will be mailed directly to the patient.

RECOMMENDATION:
Screening mammogram in one year. (Code:[id number])

BI-RADS CATEGORY  1: Negative.
